# Patient Record
Sex: Male | Born: 1980 | Race: Black or African American | Hispanic: No | Marital: Single | Smoking: Former smoker
Health system: Southern US, Community
[De-identification: ages and names within clinical notes are randomized; demographics above are authoritative.]

## PROBLEM LIST (undated history)

## (undated) DIAGNOSIS — B20 Human immunodeficiency virus [HIV] disease: Secondary | ICD-10-CM

## (undated) DIAGNOSIS — R0602 Shortness of breath: Secondary | ICD-10-CM

## (undated) DIAGNOSIS — Z21 Asymptomatic human immunodeficiency virus [HIV] infection status: Secondary | ICD-10-CM

## (undated) HISTORY — DX: Shortness of breath: R06.02

## (undated) HISTORY — DX: Human immunodeficiency virus (HIV) disease: B20

## (undated) HISTORY — DX: Asymptomatic human immunodeficiency virus (hiv) infection status: Z21

---

## 2001-03-07 ENCOUNTER — Emergency Department (HOSPITAL_COMMUNITY): Admission: EM | Admit: 2001-03-07 | Discharge: 2001-03-07 | Payer: Self-pay | Admitting: Emergency Medicine

## 2006-06-20 ENCOUNTER — Emergency Department (HOSPITAL_COMMUNITY): Admission: EM | Admit: 2006-06-20 | Discharge: 2006-06-20 | Payer: Self-pay | Admitting: Emergency Medicine

## 2006-09-21 ENCOUNTER — Ambulatory Visit: Payer: Self-pay | Admitting: Internal Medicine

## 2006-10-09 ENCOUNTER — Ambulatory Visit: Payer: Self-pay | Admitting: Internal Medicine

## 2007-01-01 ENCOUNTER — Emergency Department (HOSPITAL_COMMUNITY): Admission: EM | Admit: 2007-01-01 | Discharge: 2007-01-01 | Payer: Self-pay | Admitting: Emergency Medicine

## 2007-04-24 ENCOUNTER — Encounter: Payer: Self-pay | Admitting: Internal Medicine

## 2007-04-24 ENCOUNTER — Emergency Department (HOSPITAL_COMMUNITY): Admission: EM | Admit: 2007-04-24 | Discharge: 2007-04-24 | Payer: Self-pay | Admitting: Emergency Medicine

## 2007-04-24 DIAGNOSIS — B20 Human immunodeficiency virus [HIV] disease: Secondary | ICD-10-CM

## 2007-05-01 ENCOUNTER — Emergency Department (HOSPITAL_COMMUNITY): Admission: EM | Admit: 2007-05-01 | Discharge: 2007-05-01 | Payer: Self-pay | Admitting: Family Medicine

## 2007-05-02 ENCOUNTER — Ambulatory Visit: Payer: Self-pay | Admitting: Internal Medicine

## 2007-05-02 ENCOUNTER — Encounter: Admission: RE | Admit: 2007-05-02 | Discharge: 2007-05-02 | Payer: Self-pay | Admitting: Internal Medicine

## 2007-05-02 LAB — CONVERTED CEMR LAB
AST: 17 units/L (ref 0–37)
Alkaline Phosphatase: 74 units/L (ref 39–117)
BUN: 7 mg/dL (ref 6–23)
Basophils Relative: 1 % (ref 0–1)
Bilirubin Urine: NEGATIVE
Chlamydia, Swab/Urine, PCR: NEGATIVE
Creatinine, Ser: 0.94 mg/dL (ref 0.40–1.50)
Eosinophils Absolute: 0.3 10*3/uL (ref 0.0–0.7)
Eosinophils Relative: 6 % — ABNORMAL HIGH (ref 0–5)
GC Probe Amp, Urine: NEGATIVE
Glucose, Bld: 85 mg/dL (ref 70–99)
HCT: 45.2 % (ref 39.0–52.0)
HIV-1 antibody: POSITIVE — AB
HIV-2 Ab: UNDETERMINED — AB
HIV: REACTIVE
Hemoglobin, Urine: NEGATIVE
Hep B Core Total Ab: NEGATIVE
Hep B S Ab: NEGATIVE
Ketones, ur: NEGATIVE mg/dL
Lymphs Abs: 1.6 10*3/uL (ref 0.7–3.3)
MCHC: 31.4 g/dL (ref 30.0–36.0)
MCV: 85.6 fL (ref 78.0–100.0)
Monocytes Relative: 6 % (ref 3–11)
Neutrophils Relative %: 58 % (ref 43–77)
Potassium: 4.3 meq/L (ref 3.5–5.3)
Protein, ur: NEGATIVE mg/dL
RBC: 5.28 M/uL (ref 4.22–5.81)
RPR Ser Ql: REACTIVE — AB
Total Bilirubin: 0.5 mg/dL (ref 0.3–1.2)
Urine Glucose: NEGATIVE mg/dL
Urobilinogen, UA: 1 (ref 0.0–1.0)
WBC: 5.5 10*3/uL (ref 4.0–10.5)

## 2007-05-08 ENCOUNTER — Telehealth: Payer: Self-pay | Admitting: Internal Medicine

## 2007-05-08 ENCOUNTER — Emergency Department (HOSPITAL_COMMUNITY): Admission: EM | Admit: 2007-05-08 | Discharge: 2007-05-08 | Payer: Self-pay | Admitting: Family Medicine

## 2007-05-15 ENCOUNTER — Telehealth: Payer: Self-pay | Admitting: Internal Medicine

## 2007-05-23 ENCOUNTER — Encounter: Payer: Self-pay | Admitting: Internal Medicine

## 2007-07-11 ENCOUNTER — Encounter: Admission: RE | Admit: 2007-07-11 | Discharge: 2007-07-11 | Payer: Self-pay | Admitting: Internal Medicine

## 2007-07-11 ENCOUNTER — Ambulatory Visit: Payer: Self-pay | Admitting: Internal Medicine

## 2007-07-11 LAB — CONVERTED CEMR LAB
AST: 17 units/L (ref 0–37)
Alkaline Phosphatase: 79 units/L (ref 39–117)
BUN: 7 mg/dL (ref 6–23)
Basophils Relative: 1 % (ref 0–1)
Eosinophils Absolute: 0.7 10*3/uL (ref 0.0–0.7)
Eosinophils Relative: 12 % — ABNORMAL HIGH (ref 0–5)
Glucose, Bld: 96 mg/dL (ref 70–99)
HCT: 44 % (ref 39.0–52.0)
HIV 1 RNA Quant: 44000 copies/mL — ABNORMAL HIGH (ref <50)
HIV-1 RNA Quant, Log: 4.64 — ABNORMAL HIGH (ref <1.70)
Lymphs Abs: 1.7 10*3/uL (ref 0.7–3.3)
MCHC: 32.3 g/dL (ref 30.0–36.0)
MCV: 83.7 fL (ref 78.0–100.0)
Monocytes Relative: 6 % (ref 3–11)
Platelets: 349 10*3/uL (ref 150–400)
Sodium: 142 meq/L (ref 135–145)
Total Bilirubin: 0.5 mg/dL (ref 0.3–1.2)
WBC: 5.6 10*3/uL (ref 4.0–10.5)

## 2007-07-25 ENCOUNTER — Encounter (INDEPENDENT_AMBULATORY_CARE_PROVIDER_SITE_OTHER): Payer: Self-pay | Admitting: *Deleted

## 2007-07-31 ENCOUNTER — Ambulatory Visit: Payer: Self-pay | Admitting: Internal Medicine

## 2007-08-31 ENCOUNTER — Encounter (INDEPENDENT_AMBULATORY_CARE_PROVIDER_SITE_OTHER): Payer: Self-pay | Admitting: *Deleted

## 2007-08-31 ENCOUNTER — Encounter: Payer: Self-pay | Admitting: Internal Medicine

## 2007-09-03 ENCOUNTER — Ambulatory Visit: Payer: Self-pay | Admitting: Internal Medicine

## 2007-09-03 ENCOUNTER — Encounter: Admission: RE | Admit: 2007-09-03 | Discharge: 2007-09-03 | Payer: Self-pay | Admitting: Internal Medicine

## 2007-09-03 LAB — CONVERTED CEMR LAB
ALT: 9 units/L (ref 0–53)
BUN: 9 mg/dL (ref 6–23)
CO2: 28 meq/L (ref 19–32)
Calcium: 9.7 mg/dL (ref 8.4–10.5)
Chloride: 105 meq/L (ref 96–112)
Creatinine, Ser: 0.93 mg/dL (ref 0.40–1.50)
Glucose, Bld: 100 mg/dL — ABNORMAL HIGH (ref 70–99)
HIV 1 RNA Quant: 113000 copies/mL — ABNORMAL HIGH (ref <50)
Hemoglobin: 14.5 g/dL (ref 13.0–17.0)
Lymphocytes Relative: 35 % (ref 12–46)
Lymphs Abs: 1.4 10*3/uL (ref 0.7–3.3)
Monocytes Absolute: 0.3 10*3/uL (ref 0.2–0.7)
Monocytes Relative: 8 % (ref 3–11)
Neutro Abs: 1.9 10*3/uL (ref 1.7–7.7)
RBC: 5.36 M/uL (ref 4.22–5.81)

## 2007-09-11 ENCOUNTER — Telehealth: Payer: Self-pay | Admitting: Internal Medicine

## 2007-11-12 ENCOUNTER — Encounter: Payer: Self-pay | Admitting: Internal Medicine

## 2007-12-11 ENCOUNTER — Encounter: Admission: RE | Admit: 2007-12-11 | Discharge: 2007-12-11 | Payer: Self-pay | Admitting: Internal Medicine

## 2007-12-11 ENCOUNTER — Ambulatory Visit: Payer: Self-pay | Admitting: Internal Medicine

## 2007-12-11 LAB — CONVERTED CEMR LAB
ALT: 12 units/L (ref 0–53)
AST: 20 units/L (ref 0–37)
Albumin: 4.2 g/dL (ref 3.5–5.2)
BUN: 12 mg/dL (ref 6–23)
Basophils Relative: 1 % (ref 0–1)
Calcium: 9.5 mg/dL (ref 8.4–10.5)
Chloride: 100 meq/L (ref 96–112)
MCHC: 31.3 g/dL (ref 30.0–36.0)
Monocytes Relative: 6 % (ref 3–12)
Neutro Abs: 2 10*3/uL (ref 1.7–7.7)
Neutrophils Relative %: 45 % (ref 43–77)
Potassium: 4.4 meq/L (ref 3.5–5.3)
RBC: 5.19 M/uL (ref 4.22–5.81)
Total Protein: 8.3 g/dL (ref 6.0–8.3)
WBC: 4.5 10*3/uL (ref 4.0–10.5)

## 2007-12-12 ENCOUNTER — Encounter: Payer: Self-pay | Admitting: Internal Medicine

## 2007-12-18 ENCOUNTER — Telehealth: Payer: Self-pay | Admitting: Internal Medicine

## 2007-12-25 ENCOUNTER — Ambulatory Visit: Payer: Self-pay | Admitting: Internal Medicine

## 2007-12-25 ENCOUNTER — Encounter (INDEPENDENT_AMBULATORY_CARE_PROVIDER_SITE_OTHER): Payer: Self-pay | Admitting: *Deleted

## 2007-12-25 DIAGNOSIS — A539 Syphilis, unspecified: Secondary | ICD-10-CM

## 2008-01-17 ENCOUNTER — Telehealth: Payer: Self-pay | Admitting: Internal Medicine

## 2008-01-22 ENCOUNTER — Emergency Department (HOSPITAL_COMMUNITY): Admission: EM | Admit: 2008-01-22 | Discharge: 2008-01-22 | Payer: Self-pay | Admitting: Family Medicine

## 2008-02-12 ENCOUNTER — Telehealth (INDEPENDENT_AMBULATORY_CARE_PROVIDER_SITE_OTHER): Payer: Self-pay | Admitting: *Deleted

## 2008-02-25 ENCOUNTER — Encounter (INDEPENDENT_AMBULATORY_CARE_PROVIDER_SITE_OTHER): Payer: Self-pay | Admitting: *Deleted

## 2008-03-13 ENCOUNTER — Telehealth (INDEPENDENT_AMBULATORY_CARE_PROVIDER_SITE_OTHER): Payer: Self-pay | Admitting: *Deleted

## 2008-03-17 ENCOUNTER — Encounter: Admission: RE | Admit: 2008-03-17 | Discharge: 2008-03-17 | Payer: Self-pay | Admitting: Internal Medicine

## 2008-03-17 ENCOUNTER — Ambulatory Visit: Payer: Self-pay | Admitting: Internal Medicine

## 2008-03-19 LAB — CONVERTED CEMR LAB
Albumin: 4.8 g/dL (ref 3.5–5.2)
Alkaline Phosphatase: 76 units/L (ref 39–117)
BUN: 13 mg/dL (ref 6–23)
Eosinophils Absolute: 1.3 10*3/uL — ABNORMAL HIGH (ref 0.0–0.7)
Eosinophils Relative: 18 % — ABNORMAL HIGH (ref 0–5)
Glucose, Bld: 86 mg/dL (ref 70–99)
HCT: 44.3 % (ref 39.0–52.0)
Hemoglobin: 13.9 g/dL (ref 13.0–17.0)
Lymphs Abs: 2.2 10*3/uL (ref 0.7–4.0)
MCV: 86.2 fL (ref 78.0–100.0)
Monocytes Relative: 6 % (ref 3–12)
RBC: 5.14 M/uL (ref 4.22–5.81)
RPR Ser Ql: REACTIVE — AB
RPR Titer: 1:2 {titer}
Total Bilirubin: 1.4 mg/dL — ABNORMAL HIGH (ref 0.3–1.2)
WBC: 7.5 10*3/uL (ref 4.0–10.5)

## 2008-04-08 ENCOUNTER — Ambulatory Visit: Payer: Self-pay | Admitting: Internal Medicine

## 2008-04-14 ENCOUNTER — Telehealth (INDEPENDENT_AMBULATORY_CARE_PROVIDER_SITE_OTHER): Payer: Self-pay | Admitting: *Deleted

## 2008-05-08 ENCOUNTER — Encounter: Admission: RE | Admit: 2008-05-08 | Discharge: 2008-05-08 | Payer: Self-pay | Admitting: Internal Medicine

## 2008-05-13 ENCOUNTER — Telehealth (INDEPENDENT_AMBULATORY_CARE_PROVIDER_SITE_OTHER): Payer: Self-pay | Admitting: *Deleted

## 2008-07-08 ENCOUNTER — Telehealth (INDEPENDENT_AMBULATORY_CARE_PROVIDER_SITE_OTHER): Payer: Self-pay | Admitting: *Deleted

## 2008-07-10 ENCOUNTER — Ambulatory Visit: Payer: Self-pay | Admitting: Internal Medicine

## 2008-07-10 LAB — CONVERTED CEMR LAB
ALT: 10 units/L (ref 0–53)
Albumin: 4.1 g/dL (ref 3.5–5.2)
CO2: 27 meq/L (ref 19–32)
Calcium: 9.7 mg/dL (ref 8.4–10.5)
Chloride: 102 meq/L (ref 96–112)
Eosinophils Relative: 8 % — ABNORMAL HIGH (ref 0–5)
Glucose, Bld: 89 mg/dL (ref 70–99)
HCT: 44.5 % (ref 39.0–52.0)
HIV 1 RNA Quant: 985 copies/mL — ABNORMAL HIGH (ref <50)
HIV-1 RNA Quant, Log: 2.99 — ABNORMAL HIGH (ref <1.70)
Hemoglobin: 14.5 g/dL (ref 13.0–17.0)
Lymphocytes Relative: 25 % (ref 12–46)
Lymphs Abs: 2.1 10*3/uL (ref 0.7–4.0)
Neutro Abs: 5.3 10*3/uL (ref 1.7–7.7)
Platelets: 327 10*3/uL (ref 150–400)
Sodium: 138 meq/L (ref 135–145)
Total Bilirubin: 0.3 mg/dL (ref 0.3–1.2)
Total Protein: 7.8 g/dL (ref 6.0–8.3)
WBC: 8.4 10*3/uL (ref 4.0–10.5)

## 2008-07-25 ENCOUNTER — Ambulatory Visit: Payer: Self-pay | Admitting: Internal Medicine

## 2008-07-25 DIAGNOSIS — L0292 Furuncle, unspecified: Secondary | ICD-10-CM

## 2008-07-25 DIAGNOSIS — L0293 Carbuncle, unspecified: Secondary | ICD-10-CM

## 2008-07-28 ENCOUNTER — Telehealth (INDEPENDENT_AMBULATORY_CARE_PROVIDER_SITE_OTHER): Payer: Self-pay | Admitting: *Deleted

## 2008-08-11 ENCOUNTER — Telehealth (INDEPENDENT_AMBULATORY_CARE_PROVIDER_SITE_OTHER): Payer: Self-pay | Admitting: *Deleted

## 2008-08-15 ENCOUNTER — Emergency Department (HOSPITAL_COMMUNITY): Admission: EM | Admit: 2008-08-15 | Discharge: 2008-08-15 | Payer: Self-pay | Admitting: Emergency Medicine

## 2008-09-05 ENCOUNTER — Telehealth (INDEPENDENT_AMBULATORY_CARE_PROVIDER_SITE_OTHER): Payer: Self-pay | Admitting: *Deleted

## 2008-10-08 ENCOUNTER — Telehealth (INDEPENDENT_AMBULATORY_CARE_PROVIDER_SITE_OTHER): Payer: Self-pay | Admitting: *Deleted

## 2008-10-27 ENCOUNTER — Ambulatory Visit: Payer: Self-pay | Admitting: Internal Medicine

## 2008-10-27 LAB — CONVERTED CEMR LAB
ALT: 9 units/L (ref 0–53)
AST: 14 units/L (ref 0–37)
Basophils Absolute: 0 10*3/uL (ref 0.0–0.1)
Basophils Relative: 0 % (ref 0–1)
CO2: 27 meq/L (ref 19–32)
Calcium: 10 mg/dL (ref 8.4–10.5)
Chloride: 105 meq/L (ref 96–112)
Creatinine, Ser: 0.97 mg/dL (ref 0.40–1.50)
Hemoglobin: 14.7 g/dL (ref 13.0–17.0)
Lymphocytes Relative: 18 % (ref 12–46)
MCHC: 31.4 g/dL (ref 30.0–36.0)
Neutro Abs: 4.6 10*3/uL (ref 1.7–7.7)
Neutrophils Relative %: 56 % (ref 43–77)
Platelets: 363 10*3/uL (ref 150–400)
RDW: 13.4 % (ref 11.5–15.5)
Sodium: 144 meq/L (ref 135–145)
Total Protein: 8.1 g/dL (ref 6.0–8.3)

## 2008-11-05 ENCOUNTER — Telehealth (INDEPENDENT_AMBULATORY_CARE_PROVIDER_SITE_OTHER): Payer: Self-pay | Admitting: *Deleted

## 2008-11-14 ENCOUNTER — Ambulatory Visit: Payer: Self-pay | Admitting: Internal Medicine

## 2008-12-05 ENCOUNTER — Telehealth (INDEPENDENT_AMBULATORY_CARE_PROVIDER_SITE_OTHER): Payer: Self-pay | Admitting: *Deleted

## 2009-01-01 ENCOUNTER — Telehealth (INDEPENDENT_AMBULATORY_CARE_PROVIDER_SITE_OTHER): Payer: Self-pay | Admitting: *Deleted

## 2009-01-29 ENCOUNTER — Encounter (INDEPENDENT_AMBULATORY_CARE_PROVIDER_SITE_OTHER): Payer: Self-pay | Admitting: *Deleted

## 2009-02-12 ENCOUNTER — Ambulatory Visit: Payer: Self-pay | Admitting: Internal Medicine

## 2009-02-12 LAB — CONVERTED CEMR LAB
Albumin: 4.6 g/dL (ref 3.5–5.2)
Alkaline Phosphatase: 65 units/L (ref 39–117)
BUN: 9 mg/dL (ref 6–23)
Basophils Absolute: 0.1 10*3/uL (ref 0.0–0.1)
CO2: 26 meq/L (ref 19–32)
GFR calc Af Amer: >60 mL/min (ref >60)
GFR calc non Af Amer: >60 mL/min (ref >60)
Glucose, Bld: 57 mg/dL — ABNORMAL LOW (ref 70–99)
HIV 1 RNA Quant: 79 copies/mL — ABNORMAL HIGH (ref <48)
HIV-1 RNA Quant, Log: 1.9 — ABNORMAL HIGH (ref <1.68)
Lymphocytes Relative: 27 % (ref 12–46)
Lymphs Abs: 1.5 10*3/uL (ref 0.7–4.0)
Neutro Abs: 3 10*3/uL (ref 1.7–7.7)
Platelets: 351 10*3/uL (ref 150–400)
Potassium: 4.2 meq/L (ref 3.5–5.3)
RDW: 13.2 % (ref 11.5–15.5)
Sodium: 139 meq/L (ref 135–145)
Total Bilirubin: 1 mg/dL (ref 0.3–1.2)
Total Protein: 7.7 g/dL (ref 6.0–8.3)
WBC: 5.4 10*3/uL (ref 4.0–10.5)

## 2009-02-27 ENCOUNTER — Ambulatory Visit: Payer: Self-pay | Admitting: Internal Medicine

## 2009-02-27 DIAGNOSIS — B356 Tinea cruris: Secondary | ICD-10-CM

## 2009-03-02 ENCOUNTER — Telehealth (INDEPENDENT_AMBULATORY_CARE_PROVIDER_SITE_OTHER): Payer: Self-pay | Admitting: *Deleted

## 2009-04-01 ENCOUNTER — Telehealth (INDEPENDENT_AMBULATORY_CARE_PROVIDER_SITE_OTHER): Payer: Self-pay | Admitting: *Deleted

## 2009-04-30 ENCOUNTER — Telehealth (INDEPENDENT_AMBULATORY_CARE_PROVIDER_SITE_OTHER): Payer: Self-pay | Admitting: *Deleted

## 2009-05-26 ENCOUNTER — Telehealth (INDEPENDENT_AMBULATORY_CARE_PROVIDER_SITE_OTHER): Payer: Self-pay | Admitting: *Deleted

## 2009-05-28 ENCOUNTER — Ambulatory Visit: Payer: Self-pay | Admitting: Internal Medicine

## 2009-05-28 LAB — CONVERTED CEMR LAB
ALT: 8 units/L (ref 0–53)
AST: 14 units/L (ref 0–37)
Alkaline Phosphatase: 63 units/L (ref 39–117)
Basophils Relative: 1 % (ref 0–1)
Creatinine, Ser: 1.02 mg/dL (ref 0.40–1.50)
Eosinophils Absolute: 0.4 10*3/uL (ref 0.0–0.7)
Eosinophils Relative: 6 % — ABNORMAL HIGH (ref 0–5)
HCT: 44.1 % (ref 39.0–52.0)
Lymphs Abs: 2.1 10*3/uL (ref 0.7–4.0)
MCHC: 32.9 g/dL (ref 30.0–36.0)
MCV: 85.5 fL (ref 78.0–100.0)
Platelets: 332 10*3/uL (ref 150–400)
Sodium: 141 meq/L (ref 135–145)
Total Bilirubin: 0.5 mg/dL (ref 0.3–1.2)
WBC: 6.1 10*3/uL (ref 4.0–10.5)

## 2009-06-25 ENCOUNTER — Telehealth (INDEPENDENT_AMBULATORY_CARE_PROVIDER_SITE_OTHER): Payer: Self-pay | Admitting: *Deleted

## 2009-07-08 ENCOUNTER — Ambulatory Visit: Payer: Self-pay | Admitting: Internal Medicine

## 2009-07-24 ENCOUNTER — Telehealth (INDEPENDENT_AMBULATORY_CARE_PROVIDER_SITE_OTHER): Payer: Self-pay | Admitting: *Deleted

## 2009-08-21 ENCOUNTER — Telehealth (INDEPENDENT_AMBULATORY_CARE_PROVIDER_SITE_OTHER): Payer: Self-pay | Admitting: *Deleted

## 2009-08-25 ENCOUNTER — Ambulatory Visit: Payer: Self-pay | Admitting: Internal Medicine

## 2009-08-27 ENCOUNTER — Ambulatory Visit: Payer: Self-pay | Admitting: Internal Medicine

## 2009-09-03 ENCOUNTER — Ambulatory Visit: Payer: Self-pay | Admitting: Internal Medicine

## 2009-09-10 ENCOUNTER — Ambulatory Visit: Payer: Self-pay | Admitting: Internal Medicine

## 2009-09-28 ENCOUNTER — Telehealth (INDEPENDENT_AMBULATORY_CARE_PROVIDER_SITE_OTHER): Payer: Self-pay | Admitting: *Deleted

## 2009-10-21 ENCOUNTER — Ambulatory Visit: Payer: Self-pay | Admitting: Internal Medicine

## 2009-10-21 LAB — CONVERTED CEMR LAB
ALT: 11 units/L (ref 0–53)
AST: 15 units/L (ref 0–37)
Albumin: 4.2 g/dL (ref 3.5–5.2)
Alkaline Phosphatase: 54 units/L (ref 39–117)
Basophils Absolute: 0.1 10*3/uL (ref 0.0–0.1)
Hemoglobin: 14.2 g/dL (ref 13.0–17.0)
Lymphocytes Relative: 35 % (ref 12–46)
Neutro Abs: 2.1 10*3/uL (ref 1.7–7.7)
Platelets: 317 10*3/uL (ref 150–400)
Potassium: 4.1 meq/L (ref 3.5–5.3)
RDW: 12.8 % (ref 11.5–15.5)
Sodium: 142 meq/L (ref 135–145)
Total Protein: 7.8 g/dL (ref 6.0–8.3)

## 2009-10-23 ENCOUNTER — Telehealth (INDEPENDENT_AMBULATORY_CARE_PROVIDER_SITE_OTHER): Payer: Self-pay | Admitting: *Deleted

## 2009-11-11 ENCOUNTER — Ambulatory Visit: Payer: Self-pay | Admitting: Internal Medicine

## 2009-11-18 ENCOUNTER — Telehealth (INDEPENDENT_AMBULATORY_CARE_PROVIDER_SITE_OTHER): Payer: Self-pay | Admitting: *Deleted

## 2009-12-09 ENCOUNTER — Ambulatory Visit: Payer: Self-pay | Admitting: Internal Medicine

## 2009-12-09 DIAGNOSIS — R21 Rash and other nonspecific skin eruption: Secondary | ICD-10-CM

## 2009-12-09 LAB — CONVERTED CEMR LAB
AST: 15 units/L (ref 0–37)
BUN: 9 mg/dL (ref 6–23)
Basophils Absolute: 0 10*3/uL (ref 0.0–0.1)
Calcium: 9.3 mg/dL (ref 8.4–10.5)
Chloride: 105 meq/L (ref 96–112)
Creatinine, Ser: 1 mg/dL (ref 0.40–1.50)
Eosinophils Absolute: 0.3 10*3/uL (ref 0.0–0.7)
Eosinophils Relative: 5 % (ref 0–5)
HCT: 41.7 % (ref 39.0–52.0)
Lymphs Abs: 1.2 10*3/uL (ref 0.7–4.0)
MCV: 86.3 fL (ref >=78.0)
Platelets: 326 10*3/uL (ref 150–400)
RDW: 12.7 % (ref 11.5–15.5)
RPR Ser Ql: REACTIVE — AB
RPR Titer: 1:2 {titer}
T pallidum Antibodies (TP-PA): 65.6 — ABNORMAL HIGH (ref <1.0)

## 2009-12-11 ENCOUNTER — Encounter: Payer: Self-pay | Admitting: Internal Medicine

## 2009-12-11 LAB — CONVERTED CEMR LAB: HIV 1 RNA Quant: 59100 copies/mL — ABNORMAL HIGH (ref <48)

## 2009-12-15 ENCOUNTER — Telehealth (INDEPENDENT_AMBULATORY_CARE_PROVIDER_SITE_OTHER): Payer: Self-pay | Admitting: *Deleted

## 2009-12-19 ENCOUNTER — Telehealth (INDEPENDENT_AMBULATORY_CARE_PROVIDER_SITE_OTHER): Payer: Self-pay | Admitting: *Deleted

## 2009-12-30 ENCOUNTER — Ambulatory Visit: Payer: Self-pay | Admitting: Internal Medicine

## 2010-01-12 ENCOUNTER — Encounter (INDEPENDENT_AMBULATORY_CARE_PROVIDER_SITE_OTHER): Payer: Self-pay | Admitting: *Deleted

## 2010-01-18 ENCOUNTER — Telehealth (INDEPENDENT_AMBULATORY_CARE_PROVIDER_SITE_OTHER): Payer: Self-pay | Admitting: *Deleted

## 2010-02-10 ENCOUNTER — Encounter: Payer: Self-pay | Admitting: Internal Medicine

## 2010-02-12 ENCOUNTER — Telehealth (INDEPENDENT_AMBULATORY_CARE_PROVIDER_SITE_OTHER): Payer: Self-pay | Admitting: *Deleted

## 2010-03-10 ENCOUNTER — Ambulatory Visit: Payer: Self-pay | Admitting: Internal Medicine

## 2010-03-10 LAB — CONVERTED CEMR LAB
ALT: 8 units/L (ref 0–53)
AST: 16 units/L (ref 0–37)
BUN: 7 mg/dL (ref 6–23)
Basophils Absolute: 0 10*3/uL (ref 0.0–0.1)
Basophils Relative: 1 % (ref 0–1)
Creatinine, Ser: 0.88 mg/dL (ref 0.40–1.50)
Eosinophils Absolute: 0.3 10*3/uL (ref 0.0–0.7)
HDL: 54 mg/dL (ref >39)
HIV 1 RNA Quant: 1460 copies/mL — ABNORMAL HIGH (ref <48)
HIV-1 RNA Quant, Log: 3.16 — ABNORMAL HIGH (ref <1.68)
MCHC: 31.5 g/dL (ref 30.0–36.0)
MCV: 85.7 fL (ref 78.0–100.0)
Neutrophils Relative %: 48 % (ref 43–77)
Platelets: 320 10*3/uL (ref 150–400)
RDW: 12.8 % (ref 11.5–15.5)
Total Bilirubin: 0.5 mg/dL (ref 0.3–1.2)
Total CHOL/HDL Ratio: 1.9
VLDL: 23 mg/dL (ref 0–40)
WBC: 4.2 10*3/uL (ref 4.0–10.5)

## 2010-03-15 ENCOUNTER — Telehealth: Payer: Self-pay | Admitting: Internal Medicine

## 2010-03-31 ENCOUNTER — Ambulatory Visit: Payer: Self-pay | Admitting: Internal Medicine

## 2010-04-26 ENCOUNTER — Telehealth: Payer: Self-pay | Admitting: Internal Medicine

## 2010-05-19 ENCOUNTER — Telehealth: Payer: Self-pay | Admitting: Internal Medicine

## 2010-06-18 ENCOUNTER — Telehealth: Payer: Self-pay | Admitting: Internal Medicine

## 2010-07-05 ENCOUNTER — Ambulatory Visit: Payer: Self-pay | Admitting: Internal Medicine

## 2010-07-05 ENCOUNTER — Telehealth: Payer: Self-pay

## 2010-07-05 LAB — CONVERTED CEMR LAB
ALT: 8 units/L (ref 0–53)
AST: 16 units/L (ref 0–37)
Basophils Absolute: 0 10*3/uL (ref 0.0–0.1)
Basophils Relative: 0 % (ref 0–1)
CO2: 26 meq/L (ref 19–32)
Creatinine, Ser: 0.88 mg/dL (ref 0.40–1.50)
Eosinophils Relative: 7 % — ABNORMAL HIGH (ref 0–5)
HCT: 42.6 % (ref 39.0–52.0)
HIV 1 RNA Quant: 87900 copies/mL — ABNORMAL HIGH (ref <48)
Lymphocytes Relative: 30 % (ref 12–46)
MCHC: 30.3 g/dL (ref 30.0–36.0)
Monocytes Absolute: 0.5 10*3/uL (ref 0.1–1.0)
Neutro Abs: 2.9 10*3/uL (ref 1.7–7.7)
Platelets: 318 10*3/uL (ref 150–400)
RDW: 13.8 % (ref 11.5–15.5)
Sodium: 139 meq/L (ref 135–145)
Total Bilirubin: 0.4 mg/dL (ref 0.3–1.2)
Total Protein: 7 g/dL (ref 6.0–8.3)

## 2010-07-16 ENCOUNTER — Telehealth: Payer: Self-pay | Admitting: Internal Medicine

## 2010-07-19 ENCOUNTER — Telehealth (INDEPENDENT_AMBULATORY_CARE_PROVIDER_SITE_OTHER): Payer: Self-pay | Admitting: *Deleted

## 2010-07-23 ENCOUNTER — Ambulatory Visit: Payer: Self-pay | Admitting: Internal Medicine

## 2010-07-27 ENCOUNTER — Telehealth (INDEPENDENT_AMBULATORY_CARE_PROVIDER_SITE_OTHER): Payer: Self-pay | Admitting: *Deleted

## 2010-08-13 ENCOUNTER — Ambulatory Visit: Payer: Self-pay | Admitting: Internal Medicine

## 2010-08-16 ENCOUNTER — Telehealth (INDEPENDENT_AMBULATORY_CARE_PROVIDER_SITE_OTHER): Payer: Self-pay | Admitting: *Deleted

## 2010-09-10 ENCOUNTER — Telehealth (INDEPENDENT_AMBULATORY_CARE_PROVIDER_SITE_OTHER): Payer: Self-pay | Admitting: *Deleted

## 2010-09-23 ENCOUNTER — Ambulatory Visit: Payer: Self-pay | Admitting: Internal Medicine

## 2010-09-23 LAB — CONVERTED CEMR LAB
Albumin: 4 g/dL (ref 3.5–5.2)
Alkaline Phosphatase: 67 units/L (ref 39–117)
BUN: 8 mg/dL (ref 6–23)
Basophils Relative: 1 % (ref 0–1)
CO2: 29 meq/L (ref 19–32)
Calcium: 9.1 mg/dL (ref 8.4–10.5)
Chloride: 105 meq/L (ref 96–112)
Glucose, Bld: 95 mg/dL (ref 70–99)
HIV-1 RNA Quant, Log: 3.4 — ABNORMAL HIGH (ref <1.30)
Hemoglobin: 13.4 g/dL (ref 13.0–17.0)
Lymphocytes Relative: 41 % (ref 12–46)
MCHC: 31.4 g/dL (ref 30.0–36.0)
Monocytes Absolute: 0.3 10*3/uL (ref 0.1–1.0)
Monocytes Relative: 7 % (ref 3–12)
Neutro Abs: 1.6 10*3/uL — ABNORMAL LOW (ref 1.7–7.7)
Neutrophils Relative %: 44 % (ref 43–77)
Potassium: 3.9 meq/L (ref 3.5–5.3)
RBC: 4.92 M/uL (ref 4.22–5.81)
Sodium: 141 meq/L (ref 135–145)
Total Protein: 7.1 g/dL (ref 6.0–8.3)
WBC: 3.6 10*3/uL — ABNORMAL LOW (ref 4.0–10.5)

## 2010-10-11 ENCOUNTER — Ambulatory Visit: Payer: Self-pay | Admitting: Internal Medicine

## 2010-10-11 DIAGNOSIS — M26609 Unspecified temporomandibular joint disorder, unspecified side: Secondary | ICD-10-CM | POA: Insufficient documentation

## 2010-12-05 LAB — CONVERTED CEMR LAB
GC Probe Amp, Urine: NEGATIVE
RPR Ser Ql: REACTIVE — AB
RPR Titer: 1:16 {titer} — AB
T pallidum Antibodies (TP-PA): 38.3 — ABNORMAL HIGH (ref <1.0)

## 2010-12-07 NOTE — Assessment & Plan Note (Signed)
Summary: rash returned/ uppper and lower body/tkk   CC:  rash returned on upper and lower body.  History of Present Illness: Pt states that the last 2 weeks he has had diarrhea after taking his HIV meds.  No abdominal pain, nausea or vomiting.  He also has had outbreaks of a pruritic papular rash.  Preventive Screening-Counseling & Management  Alcohol-Tobacco     Alcohol drinks/day: 3 every other weekend     Alcohol type: mixed drinks     Smoking Status: quit     Smoking Cessation Counseling: yes     Year Started: 2006     Year Quit: 2009     Cigars/week: 5     Passive Smoke Exposure: yes  Caffeine-Diet-Exercise     Caffeine use/day: 0     Does Patient Exercise: yes     Type of exercise: cardio     Exercise (avg: min/session): 30-60     Times/week: <3  Safety-Violence-Falls     Seat Belt Use: yes   Updated Prior Medication List: REYATAZ 300 MG  CAPS (ATAZANAVIR SULFATE) Take 1 tablet by mouth once a day TRUVADA 200-300 MG  TABS (EMTRICITABINE-TENOFOVIR) Take 1 tablet by mouth once a day * NIZORAL A-D 1 % CREAM (KETOCONAZOLE) apply two times a day NORVIR 100 MG TABS (RITONAVIR) Take 1 tablet by mouth once a day LOMOTIL 2.5-0.025 MG TABS (DIPHENOXYLATE-ATROPINE) take one tablet once or twice a day TRIAMCINOLONE ACETONIDE 0.1 % CREA (TRIAMCINOLONE ACETONIDE) apply two times a day as needed  Current Allergies (reviewed today): No known allergies  Review of Systems  The patient denies anorexia, fever, weight loss, abdominal pain, and severe indigestion/heartburn.    Vital Signs:  Patient profile:   30 year old male Height:      69 inches (175.26 cm) Weight:      159.3 pounds (72.41 kg) BMI:     23.61 Temp:     98.6 degrees F (37.00 degrees C) oral Pulse rate:   85 / minute BP sitting:   126 / 70  (left arm)  Vitals Entered By: Baxter Hire) (December 09, 2009 2:51 PM) CC: rash returned on upper and lower body Is Patient Diabetic? No Pain Assessment Patient  in pain? no      Nutritional Status BMI of 19 -24 = normal Nutritional Status Detail appetite is normal per patient  Have you ever been in a relationship where you felt threatened, hurt or afraid?No   Does patient need assistance? Functional Status Self care Ambulation Normal   Physical Exam  General:  alert, well-developed, well-nourished, and well-hydrated.   Head:  normocephalic and atraumatic.   Mouth:  pharynx pink and moist.   Skin:  few papular lesions   Impression & Recommendations:  Problem # 1:  HIV INFECTION (ICD-042) I will give him some lomotil for his dairrhea.  After reviewing his Rx pick-up history I wonder if he has been taking his meds. It appears he did not pick up meds for several months. I will obtain labs today.  I encourage him to take his meds every daym Orders: Est. Patient Level III ([address]109)  Diagnostics Reviewed:  HIV: HIV positive - not AIDS (11/11/2009)   HIV-Western blot: Positive (05/02/2007)   CD4: 460 (10/22/2009)   WBC: 5.1 (10/21/2009)   Hgb: 14.2 (10/21/2009)   HCT: 43.8 (10/21/2009)   Platelets: 317 (10/21/2009) HIV-1 RNA: 641 (10/21/2009)   HBSAg: NEG (05/02/2007)  Problem # 2:  SKIN RASH (ICD-782.1) treat with  tac cream check RPR His updated medication list for this problem includes:    Triamcinolone Acetonide 0.1 % Crea (Triamcinolone acetonide) .Marland Kitchen... Apply two times a day as needed  Medications Added to Medication List This Visit: 1)  Lomotil 2.5-0.025 Mg Tabs (Diphenoxylate-atropine) .... Take one tablet once or twice a day 2)  Triamcinolone Acetonide 0.1 % Crea (Triamcinolone acetonide) .... Apply two times a day as needed  Patient Instructions: 1)  Please schedule a follow-up appointment in 2 weeks. Prescriptions: TRIAMCINOLONE ACETONIDE 0.1 % CREA (TRIAMCINOLONE ACETONIDE) apply two times a day as needed  #60gm x 1   Entered and Authorized by:   Yisroel Ramming MD   Signed by:   Yisroel Ramming MD on 12/09/2009   Method used:    Print then Give to Patient   RxID:   [id number] LOMOTIL 2.5-0.025 MG TABS (DIPHENOXYLATE-ATROPINE) take one tablet once or twice a day  #60 x 1   Entered and Authorized by:   Yisroel Ramming MD   Signed by:   Yisroel Ramming MD on 12/09/2009   Method used:   Print then Give to Patient   RxID:   [id number]

## 2010-12-07 NOTE — Progress Notes (Signed)
Summary: [facility]/pt assist meds arrived forFeb  Phone Note Refill Request      Prescriptions: NORVIR 100 MG TABS (RITONAVIR) Take 1 tablet by mouth once a day  #30 x 0   Entered by:   Paulo Fruit  BS,CPht II,MPH   Authorized by:   Yisroel Ramming MD   Signed by:   Paulo Fruit  BS,CPht II,MPH on 12/19/2009   Method used:   Samples Given   RxID:   [id number] TRUVADA 200-300 MG  TABS (EMTRICITABINE-TENOFOVIR) Take 1 tablet by mouth once a day  #30 x 0   Entered by:   Paulo Fruit  BS,CPht II,MPH   Authorized by:   Yisroel Ramming MD   Signed by:   Paulo Fruit  BS,CPht II,MPH on 12/19/2009   Method used:   Samples Given   RxID:   [id number] REYATAZ 300 MG  CAPS (ATAZANAVIR SULFATE) Take 1 tablet by mouth once a day  #30 x 0   Entered by:   Paulo Fruit  BS,CPht II,MPH   Authorized by:   Yisroel Ramming MD   Signed by:   Paulo Fruit  BS,CPht II,MPH on 12/19/2009   Method used:   Samples Given   RxID:   [id number]   Patient Assist Medication Verification: Medication: NOrvir 100mg  WNU#[id number] Exp Date:13 Feb 2011 Tech approval:MLD                Patient Assist Medication Verification: Medication:Reyataz 300mg  Lot#[id number] Exp Date:Oct 2012 Tech approval:MLD                Patient Assist Medication Verification: Medication:Truvada Lot# [id number] Exp Date:08 2014 Tech approval:MLD Will call pt on Monday, 12/21/09 Paulo Fruit  BS,CPht II,MPH  December 19, 2009 10:23 AM

## 2010-12-07 NOTE — Assessment & Plan Note (Signed)
Summary: f/u [mkj]   CC:  Follow-up visit, lab results, and pt. has some issues and would like to see THP.  History of Present Illness: Pt is having housing issues and has missed about a weeks worth of meds as a result.  He is currently back on them.  He would like to talk to someone from [facility] about his issues. He has been feeling well.  Preventive Screening-Counseling & Management  Alcohol-Tobacco     Alcohol drinks/day: 3 every other weekend     Alcohol type: mixed drinks     Smoking Status: quit     Smoking Cessation Counseling: yes     Packs/Day: 3 CIGGS     Year Started: 2006     Year Quit: 2009     Cigars/week: 5     Passive Smoke Exposure: yes  Caffeine-Diet-Exercise     Caffeine use/day: 0     Does Patient Exercise: yes     Type of exercise: cardio     Exercise (avg: min/session): 30-60     Times/week: <3  Hep-HIV-STD-Contraception     HIV Risk: no  Safety-Violence-Falls     Seat Belt Use: yes      Sexual History:  no relationship.        Drug Use:  never.        Blood Transfusions:  no.        Travel History:  none.    Comments: pt. declined condoms   Updated Prior Medication List: REYATAZ 300 MG  CAPS (ATAZANAVIR SULFATE) Take 1 tablet by mouth once a day TRUVADA 200-300 MG  TABS (EMTRICITABINE-TENOFOVIR) Take 1 tablet by mouth once a day * NIZORAL A-D 1 % CREAM (KETOCONAZOLE) apply two times a day NORVIR 100 MG TABS (RITONAVIR) Take 1 tablet by mouth once a day LOMOTIL 2.5-0.025 MG TABS (DIPHENOXYLATE-ATROPINE) take one tablet once or twice a day TRIAMCINOLONE ACETONIDE 0.1 % CREA (TRIAMCINOLONE ACETONIDE) apply two times a day as needed  Current Allergies (reviewed today): No known allergies  Review of Systems  The patient denies anorexia, fever, and weight loss.    Vital Signs:  Patient profile:   30 year old male Height:      69 inches (175.26 cm) Weight:      151.8 pounds (69 kg) BMI:     22.50 Temp:     98.4 degrees F (36.89 degrees C)  oral Pulse rate:   64 / minute BP sitting:   135 / 88  (right arm)  Vitals Entered By: Wendall Mola CMA Duncan Dull) (Mar 31, 2010 3:06 PM) CC: Follow-up visit, lab results, pt. has some issues and would like to see THP Is Patient Diabetic? No Pain Assessment Patient in pain? no      Nutritional Status BMI of 19 -24 = normal Nutritional Status Detail appetite "ok"  Have you ever been in a relationship where you felt threatened, hurt or afraid?No   Does patient need assistance? Functional Status Self care Ambulation Normal Comments no missed doses of meds per patient   Physical Exam  General:  alert, well-developed, well-nourished, and well-hydrated.   Head:  normocephalic and atraumatic.   Mouth:  pharynx pink and moist.  no thrush  Lungs:  normal breath sounds.   Heart:  normal rate, regular rhythm, and no murmur.      Impression & Recommendations:  Problem # 1:  HIV INFECTION (ICD-042) Pt.s most recent CD4ct was 460 and VL 1460 .  Pt instructed to continue  the current antiretroviral regimen.  Pt encouraged to take medication regularly and not miss doses.  Pt will f/u in 3 months for repeat blood work and will see me 2 weeks later.  Diagnostics Reviewed:  HIV: HIV positive - not AIDS (11/11/2009)   HIV-Western blot: Positive (05/02/2007)   CD4: 460 (03/10/2010)   WBC: 4.2 (03/10/2010)   Hgb: 14.0 (03/10/2010)   HCT: 44.5 (03/10/2010)   Platelets: 320 (03/10/2010) HIV genotype: * (12/11/2009)   HIV-1 RNA: 1460 (03/10/2010)   HBSAg: NEG (05/02/2007)  Orders: Est. Patient Level III (99213)Future Orders: T-CD4SP ([facility]) (CD4SP) ... 05/12/2010 T-HIV Viral Load [phone]) ... 05/12/2010 T-Comprehensive Metabolic Panel [phone]) ... 05/12/2010 T-CBC w/Diff ([id number]) ... 05/12/2010     refer to THP  Patient Instructions: 1)  Please schedule a follow-up appointment in 8 weeks, 2 weeks after labs.

## 2010-12-07 NOTE — Progress Notes (Signed)
Summary: [facility]/pt assist meds arrived for June  Phone Note Refill Request      Prescriptions: LOMOTIL 2.5-0.025 MG TABS (DIPHENOXYLATE-ATROPINE) take one tablet once or twice a day  #60 x 0   Entered by:   Paulo Fruit  BS,CPht II,MPH   Authorized by:   Yisroel Ramming MD   Signed by:   Paulo Fruit  BS,CPht II,MPH on 04/26/2010   Method used:   Samples Given   RxID:   [id number] NORVIR 100 MG TABS (RITONAVIR) Take 1 tablet by mouth once a day  #30 x 0   Entered by:   Paulo Fruit  BS,CPht II,MPH   Authorized by:   Yisroel Ramming MD   Signed by:   Paulo Fruit  BS,CPht II,MPH on 04/26/2010   Method used:   Samples Given   RxID:   [id number] REYATAZ 300 MG  CAPS (ATAZANAVIR SULFATE) Take 1 tablet by mouth once a day  #30 x 0   Entered by:   Paulo Fruit  BS,CPht II,MPH   Authorized by:   Yisroel Ramming MD   Signed by:   Paulo Fruit  BS,CPht II,MPH on 04/26/2010   Method used:   Samples Given   RxID:   [id number]  Patient Assist Medication Verification: Medication name: Diphenonxylate/atropine  RX # [id number] Tech approval:MLD  Patient Assist Medication Verification: Medication name:Norvir 100mg  RX # [id number] Tech approval:MLD  Patient Assist Medication Verification: Medication name:Reyataz 300mg  RX # [id number] Tech approval:MLD  [facility] sent the Truvada to patient's home.  They are aware of the issue to send all mediations to one place at one time and are currently working on the problem. Call placed to patient with message that assistance medications are ready for pick-up. Left message on patient's voicemail Paulo Fruit  BS,CPht II,MPH  April 26, 2010 9:27 AM

## 2010-12-07 NOTE — Progress Notes (Signed)
Summary: [facility]cadap/pt assist meds arrived for Aug --unable to reach pt.  Phone Note Refill Request      Prescriptions: LOMOTIL 2.5-0.025 MG TABS (DIPHENOXYLATE-ATROPINE) take one tablet once or twice a day  #60 x 0   Entered by:   Paulo Fruit  BS,CPht II,MPH   Authorized by:   Yisroel Ramming MD   Signed by:   Paulo Fruit  BS,CPht II,MPH on 06/18/2010   Method used:   Samples Given   RxID:   [id number] NORVIR 100 MG TABS (RITONAVIR) Take 1 tablet by mouth once a day  #30 x 0   Entered by:   Paulo Fruit  BS,CPht II,MPH   Authorized by:   Yisroel Ramming MD   Signed by:   Paulo Fruit  BS,CPht II,MPH on 06/18/2010   Method used:   Samples Given   RxID:   [id number] TRUVADA 200-300 MG  TABS (EMTRICITABINE-TENOFOVIR) Take 1 tablet by mouth once a day  #30 x 0   Entered by:   Paulo Fruit  BS,CPht II,MPH   Authorized by:   Yisroel Ramming MD   Signed by:   Paulo Fruit  BS,CPht II,MPH on 06/18/2010   Method used:   Samples Given   RxID:   [id number] REYATAZ 300 MG  CAPS (ATAZANAVIR SULFATE) Take 1 tablet by mouth once a day  #30 x 0   Entered by:   Paulo Fruit  BS,CPht II,MPH   Authorized by:   Yisroel Ramming MD   Signed by:   Paulo Fruit  BS,CPht II,MPH on 06/18/2010   Method used:   Samples Given   RxID:   [id number]  Patient Assist Medication Verification: Medication name:Norvir 100mg  RX # [id number] Tech approval:MLD  Patient Assist Medication Verification: Medication name:Truvada RX # [id number] Tech approval:MLD  Patient Assist Medication Verification: Medication name:diphenoxylate/Atropine RX # [id number] Tech approval:MLD  Patient Assist Medication Verification: Medication name:reyataz 300mg  RX # [id number] Tech approval:MLD Tried to contact patient.  Was unable to get through.  Need to verify updated phone number when patient comes to pick up Paulo Fruit  BS,CPht II,MPH  June 18, 2010 4:06 PM

## 2010-12-07 NOTE — Progress Notes (Signed)
Summary: proof of HIV  Phone Note Call from Patient   Caller: Patient Summary of Call: Pt. called requesting something showing HIV status.  Western blot and labs were printed and pt. to come to office to pick up.  Initial call taken by: Wendall Mola CMA Duncan Dull),  July 19, 2010 2:38 PM

## 2010-12-07 NOTE — Assessment & Plan Note (Signed)
Summary: 2WK F/U/VS   CC:  2 week f/u.  History of Present Illness: Pt states he feels better. He had a cold 2 weeks ago but it has resolved.  He had not been taking his meds but has re-started and has been taking them every day.  Preventive Screening-Counseling & Management  Alcohol-Tobacco     Alcohol drinks/day: 3 every other weekend     Alcohol type: mixed drinks     Smoking Status: quit     Smoking Cessation Counseling: yes     Packs/Day: 3 CIGGS     Year Started: 2006     Year Quit: 2009     Cigars/week: 5     Passive Smoke Exposure: yes  Caffeine-Diet-Exercise     Caffeine use/day: 0     Does Patient Exercise: yes     Type of exercise: cardio     Exercise (avg: min/session): 30-60     Times/week: <3  Hep-HIV-STD-Contraception     HIV Risk: no  Safety-Violence-Falls     Seat Belt Use: yes   Updated Prior Medication List: REYATAZ 300 MG  CAPS (ATAZANAVIR SULFATE) Take 1 tablet by mouth once a day TRUVADA 200-300 MG  TABS (EMTRICITABINE-TENOFOVIR) Take 1 tablet by mouth once a day * NIZORAL A-D 1 % CREAM (KETOCONAZOLE) apply two times a day NORVIR 100 MG TABS (RITONAVIR) Take 1 tablet by mouth once a day LOMOTIL 2.5-0.025 MG TABS (DIPHENOXYLATE-ATROPINE) take one tablet once or twice a day TRIAMCINOLONE ACETONIDE 0.1 % CREA (TRIAMCINOLONE ACETONIDE) apply two times a day as needed  Current Allergies (reviewed today): No known allergies  Review of Systems  The patient denies anorexia, fever, and weight loss.    Vital Signs:  Patient profile:   30 year old male Height:      69 inches (175.26 cm) Weight:      159.3 pounds (72.41 kg) BMI:     23.61 Temp:     97.9 degrees F (36.61 degrees C) oral Pulse rate:   70 / minute BP sitting:   134 / 71  (left arm)  Vitals Entered By: Starleen Arms CMA (December 30, 2009 3:54 PM) CC: 2 week f/u Is Patient Diabetic? No Pain Assessment Patient in pain? no      Nutritional Status BMI of 19 -24 =  normal Nutritional Status Detail nl  Does patient need assistance? Functional Status Self care Ambulation Normal   Physical Exam  General:  alert, well-developed, well-nourished, and well-hydrated.   Head:  normocephalic and atraumatic.   Mouth:  pharynx pink and moist.   Lungs:  normal breath sounds.      Impression & Recommendations:  Problem # 1:  HIV INFECTION (ICD-042) Will repeat labs in 6 weeks.  Genotype did not show any resistance. Diagnostics Reviewed:  HIV: HIV positive - not AIDS (11/11/2009)   HIV-Western blot: Positive (05/02/2007)   CD4: 380 (12/10/2009)   WBC: 5.2 (12/09/2009)   Hgb: 13.4 (12/09/2009)   HCT: 41.7 (12/09/2009)   Platelets: 326 (12/09/2009) HIV genotype: * (12/11/2009)   HIV-1 RNA: 59100 (12/09/2009)   HBSAg: NEG (05/02/2007)  Orders: Est. Patient Level III (99213)Future Orders: T-CD4SP ([facility]) (CD4SP) ... 02/10/2010 T-HIV Viral Load [phone]) ... 02/10/2010 T-Comprehensive Metabolic Panel [phone]) ... 02/10/2010 T-CBC w/Diff ([id number]) ... 02/10/2010 T-Lipid Profile [id number]) ... 02/10/2010  Patient Instructions: 1)  Please schedule a follow-up appointment in 8 weeks.  Process Orders Check Orders Results:     [facility]: ABN not required for this  insurance Tests Sent for requisitioning (December 30, 2009 4:21 PM):     02/10/2010: [facility] -- T-HIV Viral Load [phone] (signed)     02/10/2010: [facility] -- T-Comprehensive Metabolic Panel [[id number]] (signed)     02/10/2010: [facility] -- T-CBC w/Diff [[id number]] (signed)     02/10/2010: [facility] -- T-Lipid Profile [phone] (signed)

## 2010-12-07 NOTE — Assessment & Plan Note (Signed)
Summary: F/U/VS   CC:  follow-up visit and lab results.  History of Present Illness: patient here for follow-up.  He's been taking his medications every day.  He complains of some left-sided neck pain and jaw pain.  He does complain of some clicking when he opens his mouth.  Preventive Screening-Counseling & Management  Alcohol-Tobacco     Alcohol drinks/day: 3 every other weekend     Alcohol type: mixed drinks     Smoking Status: quit     Smoking Cessation Counseling: yes     Packs/Day: 3 CIGGS     Year Started: 2006     Year Quit: 2009     Cigars/week: 5     Passive Smoke Exposure: yes  Caffeine-Diet-Exercise     Caffeine use/day: 0     Does Patient Exercise: yes     Type of exercise: cardio     Exercise (avg: min/session): 30-60     Times/week: <3  Hep-HIV-STD-Contraception     HIV Risk: no risk noted  Safety-Violence-Falls     Seat Belt Use: yes      Sexual History:  dating.        Drug Use:  never.        Blood Transfusions:  no.        Travel History:  none.    Comments: pt. given condoms   Updated Prior Medication List: REYATAZ 300 MG  CAPS (ATAZANAVIR SULFATE) Take 1 tablet by mouth once a day TRUVADA 200-300 MG  TABS (EMTRICITABINE-TENOFOVIR) Take 1 tablet by mouth once a day * NIZORAL A-D 1 % CREAM (KETOCONAZOLE) apply two times a day NORVIR 100 MG TABS (RITONAVIR) Take 1 tablet by mouth once a day LOMOTIL 2.5-0.025 MG TABS (DIPHENOXYLATE-ATROPINE) take one tablet once or twice a day TRIAMCINOLONE ACETONIDE 0.1 % CREA (TRIAMCINOLONE ACETONIDE) apply two times a day as needed  Current Allergies (reviewed today): No known allergies  Social History: Sexual History:  dating  Review of Systems  The patient denies anorexia, fever, weight loss, and abdominal pain.    Vital Signs:  Patient profile:   30 year old male Height:      69 inches (175.26 cm) Weight:      159.8 pounds (72.64 kg) BMI:     23.68 Temp:     98.2 degrees F (36.78  degrees C) oral Pulse rate:   59 / minute BP sitting:   130 / 76  (right arm)  Vitals Entered By: Wendall Mola CMA ( AAMA) (October 11, 2010 10:00 AM) CC: follow-up visit, lab results Is Patient Diabetic? No Pain Assessment Patient in pain? no      Nutritional Status BMI of 19 -24 = normal Nutritional Status Detail appetite "good"  Have you ever been in a relationship where you felt threatened, hurt or afraid?No   Does patient need assistance? Functional Status Self care Ambulation Normal Comments no missed doses of meds per pt.   Physical Exam  General:  alert, well-developed, well-nourished, and well-hydrated.   Head:  normocephalic and atraumatic.   Mouth:  pharynx pink and moist.  Pt has significant clicking when he opens his mouth Lungs:  normal breath sounds.     Impression & Recommendations:  Problem # 1:  HIV INFECTION (ICD-042) VL decreasing. Genotype did not show any resistance. Pt encouraged to continue his current regimen and try not to miss any doses. he willf/u for repeat labsin 3 months. Diagnostics Reviewed:  HIV: HIV positive - not  AIDS (11/11/2009)   HIV-Western blot: Positive (05/02/2007)   CD4: 420 (09/24/2010)   WBC: 3.6 (09/23/2010)   Hgb: 13.4 (09/23/2010)   HCT: 42.7 (09/23/2010)   Platelets: 307 (09/23/2010) HIV genotype: See Comment (07/23/2010)   HIV-1 RNA: 2510 (09/23/2010)   HBSAg: NEG (05/02/2007)  Orders: Est. Patient Level III (99213)Future Orders: T-CD4SP ([facility]) (CD4SP) ... 01/09/2011 T-HIV Viral Load [phone]) ... 01/09/2011 T-Comprehensive Metabolic Panel [phone]) ... 01/09/2011 T-CBC w/Diff ([id number]) ... 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      PPD Results    Date of reading: 07/26/2010    Results: < 5mm    Interpretation: negative   Appended Document: added  genotype/TY  Process Orders Check Orders Results:     [facility]: ABN not required for this insurance Tests Sent for requisitioning (December 11, 2009 2:25 PM):     12/11/2009: [facility] -- T-HIV Genotype [phone] (signed)

## 2010-12-07 NOTE — Assessment & Plan Note (Signed)
Summary: 3wk f/u/vs   CC:  follow-up visit and lab results.  History of Present Illness: Pt here to get lab results. He is feeling well. He states that he has been taking his meds every day since his last visit. Prior to that he did miss some doses.  Preventive Screening-Counseling & Management  Alcohol-Tobacco     Alcohol drinks/day: 3 every other weekend     Alcohol type: mixed drinks     Smoking Status: quit     Smoking Cessation Counseling: yes     Packs/Day: 3 CIGGS     Year Started: 2006     Year Quit: 2009     Cigars/week: 5     Passive Smoke Exposure: yes  Caffeine-Diet-Exercise     Caffeine use/day: 0     Does Patient Exercise: yes     Type of exercise: cardio     Exercise (avg: min/session): 30-60     Times/week: <3  Hep-HIV-STD-Contraception     HIV Risk: risk noted  Safety-Violence-Falls     Seat Belt Use: yes      Sexual History:  no relationship.        Drug Use:  never.        Blood Transfusions:  no.        Travel History:  none.    Comments: pt. declined condoms   Updated Prior Medication List: REYATAZ 300 MG  CAPS (ATAZANAVIR SULFATE) Take 1 tablet by mouth once a day TRUVADA 200-300 MG  TABS (EMTRICITABINE-TENOFOVIR) Take 1 tablet by mouth once a day * NIZORAL A-D 1 % CREAM (KETOCONAZOLE) apply two times a day NORVIR 100 MG TABS (RITONAVIR) Take 1 tablet by mouth once a day LOMOTIL 2.5-0.025 MG TABS (DIPHENOXYLATE-ATROPINE) take one tablet once or twice a day TRIAMCINOLONE ACETONIDE 0.1 % CREA (TRIAMCINOLONE ACETONIDE) apply two times a day as needed  Current Allergies (reviewed today): No known allergies  Review of Systems  The patient denies anorexia, fever, and weight loss.    Vital Signs:  Patient profile:   30 year old male Height:      69 inches (175.26 cm) Weight:      153.12 pounds (69.60 kg) BMI:     22.69 Temp:     98.2 degrees F (36.78 degrees C) oral Pulse rate:   61 / minute BP sitting:   133 / 87  (right  arm)  Vitals Entered By: Wendall Mola CMA Duncan Dull) (August 13, 2010 10:46 AM) CC: follow-up visit, lab results Is Patient Diabetic? No Pain Assessment Patient in pain? no      Nutritional Status BMI of 19 -24 = normal Nutritional Status Detail appetite "good"  Have you ever been in a relationship where you felt threatened, hurt or afraid?No   Does patient need assistance? Functional Status Self care Ambulation Normal Comments no missed doses of meds per pt.   Physical Exam  General:  alert, well-developed, well-nourished, and well-hydrated.   Head:  normocephalic and atraumatic.   Lungs:  normal breath sounds.      Impression & Recommendations:  Problem # 1:  HIV INFECTION (ICD-042) Pt's genotype did not show any resistance. I encouraged him to take his meds regularl.  He will return in 6 weeks for repeat labs.  Diagnostics Reviewed:  HIV: HIV positive - not AIDS (11/11/2009)   HIV-Western blot: Positive (05/02/2007)   CD4: 460 (07/06/2010)   WBC: 5.3 (07/05/2010)   Hgb: 12.9 (07/05/2010)   HCT: 42.6 (07/05/2010)  Platelets: 318 (07/05/2010) HIV genotype: See Comment (07/23/2010)   HIV-1 RNA: 16109 (07/05/2010)   HBSAg: NEG (05/02/2007)  Orders: Est. Patient Level III (99213)Future Orders: T-CD4SP ([facility]) (CD4SP) ... 09/24/2010 T-HIV Viral Load [phone]) ... 09/24/2010 T-Comprehensive Metabolic Panel [phone]) ... 09/24/2010 T-CBC w/Diff ([id number]) ... 09/24/2010  Patient Instructions: 1)  Please schedule a follow-up appointment in 8 weeks, 2 weeks after labs.

## 2010-12-07 NOTE — Progress Notes (Signed)
Summary: [facility]/pt assist meds arrived for Jan  Phone Note Refill Request      Prescriptions: NORVIR 100 MG TABS (RITONAVIR) Take 1 tablet by mouth once a day  #30 x 0   Entered by:   Paulo Fruit  BS,CPht II,MPH   Authorized by:   Yisroel Ramming MD   Signed by:   Paulo Fruit  BS,CPht II,MPH on 11/18/2009   Method used:   Samples Given   RxID:   [id number] TRUVADA 200-300 MG  TABS (EMTRICITABINE-TENOFOVIR) Take 1 tablet by mouth once a day  #30 x 0   Entered by:   Paulo Fruit  BS,CPht II,MPH   Authorized by:   Yisroel Ramming MD   Signed by:   Paulo Fruit  BS,CPht II,MPH on 11/18/2009   Method used:   Samples Given   RxID:   [id number] REYATAZ 300 MG  CAPS (ATAZANAVIR SULFATE) Take 1 tablet by mouth once a day  #30 x 0   Entered by:   Paulo Fruit  BS,CPht II,MPH   Authorized by:   Yisroel Ramming MD   Signed by:   Paulo Fruit  BS,CPht II,MPH on 11/18/2009   Method used:   Samples Given   RxID:   [id number]   Patient Assist Medication Verification: Medication: Reyataz 300mg  WNU#[id number] Exp Date:Oct 2012 Tech approval:MLD                Patient Assist Medication Verification: Medication:Truvada YQI#[id number] Exp Date:06 2014 Tech approval:MLD                Patient Assist Medication Verification: Medication:Norvir 100mg  GLO#[id number] Exp Date: 02Mar 2012 Tech approval:MLD Call placed to patient with message that assistance medications are ready for pick-up. Left message Paulo Fruit  BS,CPht II,MPH  November 18, 2009 3:51 PM                   Appended Document: [facility]/pt assist meds arrived for Jan Dr. Philipp Deputy handed partient medication for January.

## 2010-12-07 NOTE — Progress Notes (Signed)
Summary: PPD  Phone Note Outgoing Call   Call placed by: Annice Pih Summary of Call: Pt. needs PPD at next office visit Initial call taken by: Wendall Mola CMA Duncan Dull),  July 27, 2010 4:05 PM

## 2010-12-07 NOTE — Progress Notes (Signed)
Summary: [facility]ADAP/pt assist meds arrived for Mar  Phone Note Refill Request      Prescriptions: NORVIR 100 MG TABS (RITONAVIR) Take 1 tablet by mouth once a day  #30 x 0   Entered by:   Paulo Fruit  BS,CPht II,MPH   Authorized by:   Yisroel Ramming MD   Signed by:   Paulo Fruit  BS,CPht II,MPH on 01/18/2010   Method used:   Samples Given   RxID:   [id number] TRUVADA 200-300 MG  TABS (EMTRICITABINE-TENOFOVIR) Take 1 tablet by mouth once a day  #30 x 0   Entered by:   Paulo Fruit  BS,CPht II,MPH   Authorized by:   Yisroel Ramming MD   Signed by:   Paulo Fruit  BS,CPht II,MPH on 01/18/2010   Method used:   Samples Given   RxID:   [id number] REYATAZ 300 MG  CAPS (ATAZANAVIR SULFATE) Take 1 tablet by mouth once a day  #30 x 0   Entered by:   Paulo Fruit  BS,CPht II,MPH   Authorized by:   Yisroel Ramming MD   Signed by:   Paulo Fruit  BS,CPht II,MPH on 01/18/2010   Method used:   Samples Given   RxID:   [id number]   Patient Assist Medication Verification: Medication: Norvir 100mg  HKV#[id number] E21 Exp Date:12 Mar 2011 Tech approval:MLD                Patient Assist Medication Verification: Medication:Truvada Lot# [id number] Exp Date:09 2014 Tech approval:MLD                Patient Assist Medication Verification: Medication:Reyataz 300mg  Lot#[id number] Exp Date:Nov 2012 Tech approval:MLD Call placed to patient with message that assistance medications are ready for pick-up. Left message Paulo Fruit  BS,CPht II,MPH  January 18, 2010 3:41 PM

## 2010-12-07 NOTE — Progress Notes (Signed)
Summary: ADAP- Norvir, Diphenoxylate/Atropine, Truvada, Reyataz  Phone Note Outgoing Call   Summary of Call: ADAP Norvir, Diphenoxylate/Atropine, Truvada, Reyataz Medication(s) have arrived.  Left message or spoke to patient advising them they were ready for pickup.    Initial call taken by: Altamease Oiler,  August 16, 2010 5:00 PM     Appended Document: ADAP- Norvir, Diphenoxylate/Atropine, Truvada, Reyataz Pt. picked up ADAP meds

## 2010-12-07 NOTE — Progress Notes (Signed)
Summary: ncadap meds arrived for Sept--left msg for pt to call office  Phone Note Refill Request      Prescriptions: LOMOTIL 2.5-0.025 MG TABS (DIPHENOXYLATE-ATROPINE) take one tablet once or twice a day  #60 x 0   Entered by:   Paulo Fruit  BS,CPht II,MPH   Authorized by:   Yisroel Ramming MD   Signed by:   Paulo Fruit  BS,CPht II,MPH on 07/16/2010   Method used:   Samples Given   RxID:   [id number] NORVIR 100 MG TABS (RITONAVIR) Take 1 tablet by mouth once a day  #30 x 0   Entered by:   Paulo Fruit  BS,CPht II,MPH   Authorized by:   Yisroel Ramming MD   Signed by:   Paulo Fruit  BS,CPht II,MPH on 07/16/2010   Method used:   Samples Given   RxID:   [id number] TRUVADA 200-300 MG  TABS (EMTRICITABINE-TENOFOVIR) Take 1 tablet by mouth once a day  #30 x 0   Entered by:   Paulo Fruit  BS,CPht II,MPH   Authorized by:   Yisroel Ramming MD   Signed by:   Paulo Fruit  BS,CPht II,MPH on 07/16/2010   Method used:   Samples Given   RxID:   [id number] REYATAZ 300 MG  CAPS (ATAZANAVIR SULFATE) Take 1 tablet by mouth once a day  #30 x 0   Entered by:   Paulo Fruit  BS,CPht II,MPH   Authorized by:   Yisroel Ramming MD   Signed by:   Paulo Fruit  BS,CPht II,MPH on 07/16/2010   Method used:   Samples Given   RxID:   [id number]  Patient Assist Medication Verification: Medication name: diphenoxylate/atropine RX # [id number] Tech approval:MLD  Patient Assist Medication Verification: Medication name:Reyataz 300mg  RX # [id number] Tech approval:MLD  Patient Assist Medication Verification: Medication name:truvada RX # [id number] Tech approval:MLD  Patient Assist Medication Verification: Medication name: Norvir 100mg  RX # [id number] Tech approval:MLD Call placed to patient with message that assistance medications are ready for pick-up. Left message  on pt's vm to contact our office. Paulo Fruit  BS,CPht II,MPH  July 16, 2010 2:43 PM

## 2010-12-07 NOTE — Progress Notes (Signed)
Summary: NCADAP rxes arrived  Phone Note Outgoing Call   Call placed by: Jennet Maduro RN,  September 10, 2010 2:52 PM Call placed to: Patient Action Taken: Assistance medications ready for pick up Summary of Call: NCADAP rxes arrived.  Message left for pt. to pick up rxes. Jennet Maduro RN  September 10, 2010 2:53 PM

## 2010-12-07 NOTE — Progress Notes (Signed)
Summary: refill/mld  Phone Note From Pharmacy   Caller: [facility] Reason for Call: Needs renewal Summary of Call: Need renewals on ARV's and Lomotil. Initial call taken by: Paulo Fruit  BS,CPht II,MPH,  May 19, 2010 4:44 PM    Prescriptions: LOMOTIL 2.5-0.025 MG TABS (DIPHENOXYLATE-ATROPINE) take one tablet once or twice a day  #60 x 0   Entered by:   Paulo Fruit  BS,CPht II,MPH   Authorized by:   Yisroel Ramming MD   Signed by:   Paulo Fruit  BS,CPht II,MPH on 05/19/2010   Method used:   Telephoned to ...       [facility] [phone]* (retail)       [address]       [place], [place]  [address]       Ph: [phone]       Fax:    RxID:   [id number] NORVIR 100 MG TABS (RITONAVIR) Take 1 tablet by mouth once a day  #30 x 6   Entered by:   Paulo Fruit  BS,CPht II,MPH   Authorized by:   Yisroel Ramming MD   Signed by:   Paulo Fruit  BS,CPht II,MPH on 05/19/2010   Method used:   Electronically to        [facility] [phone]* (retail)       [address]       [place], [place]  [address]       Ph: [phone]       Fax:    RxID:   [id number] TRUVADA 200-300 MG  TABS (EMTRICITABINE-TENOFOVIR) Take 1 tablet by mouth once a day  #30 x 6   Entered by:   Paulo Fruit  BS,CPht II,MPH   Authorized by:   Yisroel Ramming MD   Signed by:   Paulo Fruit  BS,CPht II,MPH on 05/19/2010   Method used:   Electronically to        [facility] [phone]* (retail)       [address]       [place], [place]  [address]       Ph: [phone]       Fax:    RxID:   [id number] REYATAZ 300 MG  CAPS (ATAZANAVIR SULFATE) Take 1 tablet by mouth once a day  #30 x 6   Entered by:   Paulo Fruit  BS,CPht II,MPH   Authorized by:   Yisroel Ramming MD   Signed by:   Paulo Fruit  BS,CPht II,MPH on 05/19/2010   Method used:   Electronically to        [facility] [phone]* (retail)       [address]       [place], [place]  [address]       Ph: [phone]       Fax:    RxID:   [id number]  Paulo Fruit  BS,CPht  II,MPH  May 19, 2010 4:47 PM

## 2010-12-07 NOTE — Progress Notes (Signed)
Summary: [facility]ADAP/pt assist meds arrived for Apr  Phone Note Refill Request      Prescriptions: NORVIR 100 MG TABS (RITONAVIR) Take 1 tablet by mouth once a day  #30 x 0   Entered by:   Paulo Fruit  BS,CPht II,MPH   Authorized by:   Yisroel Ramming MD   Signed by:   Paulo Fruit  BS,CPht II,MPH on 02/12/2010   Method used:   Samples Given   RxID:   [id number] TRUVADA 200-300 MG  TABS (EMTRICITABINE-TENOFOVIR) Take 1 tablet by mouth once a day  #30 x 0   Entered by:   Paulo Fruit  BS,CPht II,MPH   Authorized by:   Yisroel Ramming MD   Signed by:   Paulo Fruit  BS,CPht II,MPH on 02/12/2010   Method used:   Samples Given   RxID:   [id number] REYATAZ 300 MG  CAPS (ATAZANAVIR SULFATE) Take 1 tablet by mouth once a day  #30 x 0   Entered by:   Paulo Fruit  BS,CPht II,MPH   Authorized by:   Yisroel Ramming MD   Signed by:   Paulo Fruit  BS,CPht II,MPH on 02/12/2010   Method used:   Samples Given   RxID:   [id number]   Patient Assist Medication Verification: Medication: Reyataz 300mg  Lot#[id number] Exp Date:Jan 2013 Tech approval:MLD                Patient Assist Medication Verification: Medication:Norvir 100mg  Lot# [id number] Exp Date:08 Aug 2011 Tech approval:MLD                Patient Assist Medication Verification: Medication: Truvada Lot# [id number] Exp Date:09 2014 Tech approval:MLD  Tried to contact patient. Was unable to leave a message at the time call was placed because patient had phone set to unavailability. Paulo Fruit  BS,CPht II,MPH  February 12, 2010 10:00 AM

## 2010-12-07 NOTE — Progress Notes (Signed)
Summary: Pt picking up ADAP meds  Phone Note Other Incoming   Caller: Pt walked into clinic Summary of Call: Pt picked up ADAP Meds. from 06-07-10 Tomasita Morrow RN  July 05, 2010 9:29 AM  Initial call taken by: Tomasita Morrow RN,  July 05, 2010 9:29 AM

## 2010-12-07 NOTE — Miscellaneous (Signed)
Summary: clinical update/ryan white [place] appr til 02/05/11  Clinical Lists Changes  Observations: Added new observation of AIDSDAP: Yes 2011 (01/12/2010 10:23)

## 2010-12-07 NOTE — Progress Notes (Signed)
Summary: [facility]ADAP/pt assist new med arrived for Feb  Phone Note Refill Request      Prescriptions: LOMOTIL 2.5-0.025 MG TABS (DIPHENOXYLATE-ATROPINE) take one tablet once or twice a day  #60 x 0   Entered by:   Paulo Fruit  BS,CPht II,MPH   Authorized by:   Yisroel Ramming MD   Signed by:   Paulo Fruit  BS,CPht II,MPH on 12/15/2009   Method used:   Samples Given   RxID:   [id number]  Patient Assist Medication Verification: Medication name: Diphenoxylate/Atropine 2.5mg /0.25mg  RX #  [id number] Tech approval:MLD Call placed to patient with message that assistance medications are ready for pick-up. Patient has an appt to renew for ADADP program on Wednesday, he will pick up then. Paulo Fruit  BS,CPht II,MPH  December 15, 2009 2:55 PM

## 2010-12-07 NOTE — Assessment & Plan Note (Signed)
Summary: F/U OV/VS   CC:  follow-up visit and lab results.  History of Present Illness: Pt here for f/u.  He states that he missed 5 doses of his HIV meds.  He c/o some low back pain.  No radiation of the pain.  Preventive Screening-Counseling & Management  Alcohol-Tobacco     Alcohol drinks/day: 3 every other weekend     Alcohol type: mixed drinks     Smoking Status: quit     Smoking Cessation Counseling: yes     Packs/Day: 3 CIGGS     Year Started: 2006     Year Quit: 2009     Cigars/week: 5     Passive Smoke Exposure: yes  Caffeine-Diet-Exercise     Caffeine use/day: 0     Does Patient Exercise: yes     Type of exercise: cardio     Exercise (avg: min/session): 30-60     Times/week: <3  Hep-HIV-STD-Contraception     HIV Risk: no  Safety-Violence-Falls     Seat Belt Use: yes      Sexual History:  no relationship.        Drug Use:  never.        Blood Transfusions:  no.        Travel History:  none.    Comments: pt. gjven condoms   Updated Prior Medication List: REYATAZ 300 MG  CAPS (ATAZANAVIR SULFATE) Take 1 tablet by mouth once a day TRUVADA 200-300 MG  TABS (EMTRICITABINE-TENOFOVIR) Take 1 tablet by mouth once a day * NIZORAL A-D 1 % CREAM (KETOCONAZOLE) apply two times a day NORVIR 100 MG TABS (RITONAVIR) Take 1 tablet by mouth once a day LOMOTIL 2.5-0.025 MG TABS (DIPHENOXYLATE-ATROPINE) take one tablet once or twice a day TRIAMCINOLONE ACETONIDE 0.1 % CREA (TRIAMCINOLONE ACETONIDE) apply two times a day as needed  Current Allergies (reviewed today): No known allergies  Review of Systems  The patient denies anorexia, fever, and weight loss.    Vital Signs:  Patient profile:   30 year old male Height:      69 inches (175.26 cm) Weight:      147.8 pounds (64.27 kg) BMI:     21.91 Temp:     98.0 degrees F (36.72 degrees C) oral Pulse rate:   62 / minute BP sitting:   137 / 79  (right arm)  Vitals Entered By: Wendall Mola CMA Duncan Dull)  (July 23, 2010 10:34 AM) CC: follow-up visit, lab results Is Patient Diabetic? No Pain Assessment Patient in pain? no      Nutritional Status BMI of 19 -24 = normal Nutritional Status Detail appetite "up and down"  Does patient need assistance? Functional Status Self care Ambulation Normal Comments pt. missed 5 doses of meds since last visit   Physical Exam  General:  alert, well-developed, well-nourished, and well-hydrated.   Head:  normocephalic and atraumatic.   Mouth:  pharynx pink and moist.   Lungs:  normal breath sounds.     Impression & Recommendations:  Problem # 1:  HIV INFECTION (ICD-042) VL up concerned about resistance.  Will obtain a genotype and have pt return in 3 weeks for results. Influenza vaccine given. Orders: T-HIV Genotype (57846-96295) Est. Patient Level III (28413)  Diagnostics Reviewed:  HIV: HIV positive - not AIDS (11/11/2009)   HIV-Western blot: Positive (05/02/2007)   CD4: 460 (07/06/2010)   WBC: 5.3 (07/05/2010)   Hgb: 12.9 (07/05/2010)   HCT: 42.6 (07/05/2010)   Platelets: 318 (07/05/2010) HIV  genotype: * (12/11/2009)   HIV-1 RNA: 94854 (07/05/2010)   HBSAg: NEG (05/02/2007)  Other Orders: Influenza Vaccine NON MCR (62703)  Patient Instructions: 1)  Please schedule a follow-up appointment in 3 weeks.   Immunizations Administered:  Influenza Vaccine # 1:    Vaccine Type: Fluvax Non-MCR    Site: left deltoid    Mfr: [employer]    Dose: 0.5 ml    Route: IM    Given by: Wendall Mola CMA ( AAMA)    Exp. Date: 02/06/2011    Lot #: [id number]    VIS given: 06/01/10 version given July 23, 2010.  Flu Vaccine Consent Questions:    Do you have a history of severe allergic reactions to this vaccine? no    Any prior history of allergic reactions to egg and/or gelatin? no    Do you have a sensitivity to the preservative Thimersol? no    Do you have a past history of Guillan-Barre Syndrome? no    Do you currently have an acute  febrile illness? no    Have you ever had a severe reaction to latex? no    Vaccine information given and explained to patient? yes

## 2010-12-07 NOTE — Miscellaneous (Signed)
Summary: Appointment No Show  Appointment status changed to no show by [employer] on 02/10/2010 4:36 PM.  No Show Comments ---------------- LABS/VS  Appointment Information ----------------------- Appt Type:  LAB NO DOCUMENT      Date:  Wednesday, February 10, 2010      Time:  9:30 AM for 30 min   Urgency:  Routine   Made By:  Pearson Grippe  To Visit:  [id number]    Reason:  LABS/VS  Appt Comments ------------- -- 02/10/10 16:36: ([facility]) NO SHOW -- LABS/VS -- 02/08/10 14:52: ([facility]) BOOKED -- Routine LAB NO DOCUMENT at 02/10/2010 9:30 AM for 30 min LABS/VS -- 02/08/10 14:52: ([facility]) BOOKED -- Routine LAB NO DOCUMENT at 02/10/2010 9:30 AM for 30 min LABS/VS -- 3/3

## 2010-12-07 NOTE — Progress Notes (Signed)
Summary: [facility]AD[facility]/pt assist meds arrived for May  Phone Note Refill Request      Prescriptions: NORVIR 100 MG TABS (RITONAVIR) Take 1 tablet by mouth once a day  #30 x 0   Entered by:   Paulo Fruit  BS,CPht II,MPH   Authorized by:   Yisroel Ramming MD   Signed by:   Paulo Fruit  BS,CPht II,MPH on 03/15/2010   Method used:   Samples Given   RxID:   [id number] TRUVADA 200-300 MG  TABS (EMTRICITABINE-TENOFOVIR) Take 1 tablet by mouth once a day  #30 x 0   Entered by:   Paulo Fruit  BS,CPht II,MPH   Authorized by:   Yisroel Ramming MD   Signed by:   Paulo Fruit  BS,CPht II,MPH on 03/15/2010   Method used:   Samples Given   RxID:   [id number] REYATAZ 300 MG  CAPS (ATAZANAVIR SULFATE) Take 1 tablet by mouth once a day  #30 x 0   Entered by:   Paulo Fruit  BS,CPht II,MPH   Authorized by:   Yisroel Ramming MD   Signed by:   Paulo Fruit  BS,CPht II,MPH on 03/15/2010   Method used:   Samples Given   RxID:   [id number]   Patient Assist Medication Verification: Medication: Reyataz 300mg  Lot#[id number] Exp Date:Feb 2013 Tech approval:MLD                Patient Assist Medication Verification: Medication:Norvir 100mg  QQV#[id number] Exp Date:08 Aug 2011 Tech approval:MLD                Patient Assist Medication Verification: Medication:truvada Lot# [id number] Exp Date:10 2014 Tech approval:MLD Call placed to patient with message that assistance medications are ready for pick-up. Left message on pt's voicemail Paulo Fruit  BS,CPht II,MPH  Mar 15, 2010 3:05 PM

## 2010-12-07 NOTE — Assessment & Plan Note (Signed)
Summary: 2WK F/U/VS   CC:  f/u labs.  History of Present Illness: Pt here for f/u.  He feels well. Missed 2 - 3 days of his HIV meds.  Preventive Screening-Counseling & Management  Alcohol-Tobacco     Alcohol drinks/day: 3 every other weekend     Alcohol type: mixed drinks     Smoking Status: current     Smoking Cessation Counseling: yes     Packs/Day: 3 CIGGS     Year Started: 2006     Year Quit: 2009     Cigars/week: 5     Passive Smoke Exposure: yes  Caffeine-Diet-Exercise     Caffeine use/day: 0     Does Patient Exercise: yes     Type of exercise: cardio     Exercise (avg: min/session): 30-60     Times/week: <3  Hep-HIV-STD-Contraception     HIV Risk: no  Safety-Violence-Falls     Seat Belt Use: yes      Sexual History:  no relationship.        Drug Use:  never.        Blood Transfusions:  no.        Travel History:  none.     Updated Prior Medication List: REYATAZ 300 MG  CAPS (ATAZANAVIR SULFATE) Take 1 tablet by mouth once a day TRUVADA 200-300 MG  TABS (EMTRICITABINE-TENOFOVIR) Take 1 tablet by mouth once a day * NIZORAL A-D 1 % CREAM (KETOCONAZOLE) apply two times a day NORVIR 100 MG TABS (RITONAVIR) Take 1 tablet by mouth once a day  Current Allergies (reviewed today): No known allergies  Review of Systems  The patient denies anorexia, fever, and weight loss.    Vital Signs:  Patient profile:   30 year old male Height:      69 inches (175.26 cm) Weight:      158.56 pounds (72.07 kg) BMI:     23.50 Temp:     97.0 degrees F (36.11 degrees C) oral Pulse rate:   71 / minute BP sitting:   135 / 80  (right arm)  Vitals Entered By: Starleen Arms CMA (November 11, 2009 9:34 AM) CC: f/u labs Is Patient Diabetic? No Pain Assessment Patient in pain? no      Nutritional Status BMI of 19 -24 = normal Nutritional Status Detail nl  Does patient need assistance? Functional Status Self care Ambulation Normal   Physical Exam  General:   alert, well-developed, well-nourished, and well-hydrated.   Head:  normocephalic and atraumatic.          Medication Adherence: 11/11/2009   Adherence to medications reviewed with patient. Counseling to provide adequate adherence provided                                Impression & Recommendations:  Problem # 1:  HIV INFECTION (ICD-042) VL up slightly hopefully not dut to developing resistance.  I encouraged him to take his meds every day. Will repeat labs in 3 months. Diagnostics Reviewed:  HIV: REACTIVE (05/02/2007)   HIV-Western blot: Positive (05/02/2007)   CD4: 460 (10/22/2009)   WBC: 5.1 (10/21/2009)   Hgb: 14.2 (10/21/2009)   HCT: 43.8 (10/21/2009)   Platelets: 317 (10/21/2009) HIV-1 RNA: 641 (10/21/2009)   HBSAg: NEG (05/02/2007)  Orders: Est. Patient Level III (99213)Future Orders: T-CD4SP ([facility]) (CD4SP) ... 02/09/2010 T-HIV Viral Load [phone]) ... 02/09/2010 T-Comprehensive Metabolic Panel [phone]) ... 02/09/2010 T-CBC w/Diff (  [id number]) ... 02/09/2010 T-RPR (Syphilis) [phone]) ... 02/09/2010  Patient Instructions: 1)  Please schedule a follow-up appointment in 3 months, 2 weeks after labs.  Process Orders Check Orders Results:     [facility]: ABN not required for this insurance Tests Sent for requisitioning (November 11, 2009 9:50 AM):     02/09/2010: [facility] -- T-HIV Viral Load [phone] (signed)     02/09/2010: [facility] -- T-Comprehensive Metabolic Panel [[id number]] (signed)     02/09/2010: [facility] -- T-CBC w/Diff [[id number]] (signed)     02/09/2010: [facility] -- T-RPR (Syphilis) [phone] (signed)         Medication Adherence: 11/11/2009   Adherence to medications reviewed with patient. Counseling to provide adequate adherence provided

## 2011-01-03 ENCOUNTER — Encounter: Payer: Self-pay | Admitting: Adult Health

## 2011-01-03 ENCOUNTER — Other Ambulatory Visit: Payer: Self-pay | Admitting: Adult Health

## 2011-01-03 ENCOUNTER — Other Ambulatory Visit (INDEPENDENT_AMBULATORY_CARE_PROVIDER_SITE_OTHER): Payer: Self-pay

## 2011-01-03 DIAGNOSIS — B20 Human immunodeficiency virus [HIV] disease: Secondary | ICD-10-CM

## 2011-01-03 LAB — CONVERTED CEMR LAB
ALT: 12 units/L (ref 0–53)
AST: 18 units/L (ref 0–37)
Albumin: 4 g/dL (ref 3.5–5.2)
Alkaline Phosphatase: 61 units/L (ref 39–117)
BUN: 8 mg/dL (ref 6–23)
Basophils Absolute: 0 10*3/uL (ref 0.0–0.1)
Basophils Relative: 1 % (ref 0–1)
Chloride: 102 meq/L (ref 96–112)
Creatinine, Ser: 0.88 mg/dL (ref 0.40–1.50)
Eosinophils Relative: 6 % — ABNORMAL HIGH (ref 0–5)
HCT: 40.8 % (ref 39.0–52.0)
HIV 1 RNA Quant: 7480 copies/mL — ABNORMAL HIGH (ref <20)
Hemoglobin: 12.9 g/dL — ABNORMAL LOW (ref 13.0–17.0)
MCHC: 31.6 g/dL (ref 30.0–36.0)
Monocytes Absolute: 0.3 10*3/uL (ref 0.1–1.0)
Platelets: 282 10*3/uL (ref 150–400)
Potassium: 4.5 meq/L (ref 3.5–5.3)
RDW: 13.3 % (ref 11.5–15.5)

## 2011-01-04 LAB — T-HELPER CELL (CD4) - (RCID CLINIC ONLY)
CD4 % Helper T Cell: 28 % — ABNORMAL LOW (ref 33–55)
CD4 T Cell Abs: 350 uL — ABNORMAL LOW (ref 400–2700)

## 2011-01-05 ENCOUNTER — Encounter: Payer: Self-pay | Admitting: Adult Health

## 2011-01-06 ENCOUNTER — Encounter (INDEPENDENT_AMBULATORY_CARE_PROVIDER_SITE_OTHER): Payer: Self-pay | Admitting: *Deleted

## 2011-01-07 ENCOUNTER — Encounter (INDEPENDENT_AMBULATORY_CARE_PROVIDER_SITE_OTHER): Payer: Self-pay | Admitting: *Deleted

## 2011-01-13 NOTE — Miscellaneous (Signed)
  Clinical Lists Changes 

## 2011-01-13 NOTE — Miscellaneous (Addendum)
  Clinical Lists Changes  Orders: Added new Test order of T-HIV Genotype [phone]) - Signed

## 2011-01-13 NOTE — Miscellaneous (Signed)
  Clinical Lists Changes  Observations: Added new observation of PCTFPL: 104.75  (01/06/2011 15:04) Added new observation of AIDSDAP: PENDING APPROVAL 2012  (01/06/2011 15:04) Added new observation of FINASSESSDT: [id number]  (01/06/2011 15:04) Added new observation of HOUSEINCOME: 64332  (01/06/2011 15:04)

## 2011-01-17 ENCOUNTER — Ambulatory Visit (INDEPENDENT_AMBULATORY_CARE_PROVIDER_SITE_OTHER): Payer: Self-pay | Admitting: Infectious Diseases

## 2011-01-17 ENCOUNTER — Ambulatory Visit: Payer: Self-pay | Admitting: Infectious Diseases

## 2011-01-17 DIAGNOSIS — B20 Human immunodeficiency virus [HIV] disease: Secondary | ICD-10-CM

## 2011-01-25 LAB — T-HELPER CELL (CD4) - (RCID CLINIC ONLY)
CD4 % Helper T Cell: 29 % — ABNORMAL LOW (ref 33–55)
CD4 T Cell Abs: 460 uL (ref 400–2700)

## 2011-01-26 ENCOUNTER — Encounter (INDEPENDENT_AMBULATORY_CARE_PROVIDER_SITE_OTHER): Payer: Self-pay | Admitting: *Deleted

## 2011-01-26 LAB — T-HELPER CELL (CD4) - (RCID CLINIC ONLY)
CD4 % Helper T Cell: 31 % — ABNORMAL LOW (ref 33–55)
CD4 T Cell Abs: 380 uL — ABNORMAL LOW (ref 400–2700)

## 2011-02-03 NOTE — Miscellaneous (Signed)
Summary: ADAP APPROVED TIL 08/07/11  Clinical Lists Changes  Observations: Added new observation of AIDSDAP: Yes 2012 (01/26/2011 11:05)

## 2011-02-07 ENCOUNTER — Ambulatory Visit: Payer: Self-pay | Admitting: Infectious Diseases

## 2011-02-08 LAB — T-HELPER CELL (CD4) - (RCID CLINIC ONLY): CD4 T Cell Abs: 460 uL (ref 400–2700)

## 2011-02-16 LAB — T-HELPER CELL (CD4) - (RCID CLINIC ONLY): CD4 T Cell Abs: 380 uL — ABNORMAL LOW (ref 400–2700)

## 2011-03-16 ENCOUNTER — Other Ambulatory Visit: Payer: Self-pay

## 2011-03-18 ENCOUNTER — Other Ambulatory Visit: Payer: Self-pay | Admitting: Adult Health

## 2011-03-18 ENCOUNTER — Other Ambulatory Visit (INDEPENDENT_AMBULATORY_CARE_PROVIDER_SITE_OTHER): Payer: Self-pay

## 2011-03-18 ENCOUNTER — Other Ambulatory Visit: Payer: Self-pay | Admitting: Infectious Diseases

## 2011-03-18 DIAGNOSIS — B20 Human immunodeficiency virus [HIV] disease: Secondary | ICD-10-CM

## 2011-03-18 LAB — T-HELPER CELL (CD4) - (RCID CLINIC ONLY)
CD4 % Helper T Cell: 27 % — ABNORMAL LOW (ref 33–55)
CD4 T Cell Abs: 410 uL (ref 400–2700)

## 2011-03-19 LAB — CBC WITH DIFFERENTIAL/PLATELET
Eosinophils Absolute: 0.3 10*3/uL (ref 0.0–0.7)
Hemoglobin: 13.3 g/dL (ref 13.0–17.0)
Lymphs Abs: 1.5 10*3/uL (ref 0.7–4.0)
MCH: 27.4 pg (ref 26.0–34.0)
Neutro Abs: 1.9 10*3/uL (ref 1.7–7.7)
Neutrophils Relative %: 48 % (ref 43–77)
Platelets: 315 10*3/uL (ref 150–400)
RBC: 4.85 MIL/uL (ref 4.22–5.81)
WBC: 3.9 10*3/uL — ABNORMAL LOW (ref 4.0–10.5)

## 2011-03-19 LAB — COMPREHENSIVE METABOLIC PANEL
ALT: 12 U/L (ref 0–53)
AST: 17 U/L (ref 0–37)
Albumin: 4.1 g/dL (ref 3.5–5.2)
Alkaline Phosphatase: 59 U/L (ref 39–117)
BUN: 9 mg/dL (ref 6–23)
Creat: 0.99 mg/dL (ref 0.40–1.50)
Potassium: 4.4 mEq/L (ref 3.5–5.3)

## 2011-03-22 LAB — HIV-1 RNA QUANT-NO REFLEX-BLD
HIV 1 RNA Quant: 32400 copies/mL — ABNORMAL HIGH (ref <20)
HIV-1 RNA Quant, Log: 4.51 {Log} — ABNORMAL HIGH (ref <1.30)

## 2011-03-30 ENCOUNTER — Ambulatory Visit (INDEPENDENT_AMBULATORY_CARE_PROVIDER_SITE_OTHER): Payer: Self-pay | Admitting: Infectious Diseases

## 2011-03-30 ENCOUNTER — Encounter: Payer: Self-pay | Admitting: Infectious Diseases

## 2011-03-30 DIAGNOSIS — A539 Syphilis, unspecified: Secondary | ICD-10-CM

## 2011-03-30 DIAGNOSIS — Z23 Encounter for immunization: Secondary | ICD-10-CM

## 2011-03-30 DIAGNOSIS — Z79899 Other long term (current) drug therapy: Secondary | ICD-10-CM

## 2011-03-30 DIAGNOSIS — B20 Human immunodeficiency virus [HIV] disease: Secondary | ICD-10-CM

## 2011-03-30 NOTE — Assessment & Plan Note (Signed)
Will repeat his RPR at f/u visit.

## 2011-03-30 NOTE — Progress Notes (Signed)
Addended by: Wendall Mola on: 03/30/2011 12:37 PM   Modules accepted: Orders

## 2011-03-30 NOTE — Assessment & Plan Note (Signed)
His CD4 is in a good range however he is not taking his ART. He is given a pill box to see if this can help him with his adherence. He is given condoms. He will start Hep A vax series today. He will return to clinic in 3- 4 months with labs prior including a genotype.

## 2011-03-30 NOTE — Progress Notes (Signed)
  Subjective:    Patient ID: Donald Dean, male    DOB: [birth year], 30 y.o.   MRN: [id number]  HPI 30 yo M with HIV+ dx 2001 on a routine test, last CD4 410 and VL  32,400 (03-18-11). He had a naive genotype 07-2010. States he is feeling great.  Has been missing his meds, missed an entire week.   Review of Systems  Constitutional: Negative for appetite change and unexpected weight change.  Gastrointestinal: Negative for diarrhea and constipation.  Genitourinary: Negative for difficulty urinating.       Objective:   Physical Exam  Constitutional: He appears well-developed and well-nourished.  Eyes: EOM are normal. Pupils are equal, round, and reactive to light.  Neck: Neck supple.  Cardiovascular: Normal rate, regular rhythm and normal heart sounds.   Pulmonary/Chest: Effort normal and breath sounds normal. No respiratory distress.  Abdominal: Soft. Bowel sounds are normal. He exhibits no distension.  Lymphadenopathy:    He has cervical adenopathy.          Assessment & Plan:

## 2011-04-11 ENCOUNTER — Other Ambulatory Visit: Payer: Self-pay | Admitting: *Deleted

## 2011-04-11 DIAGNOSIS — B20 Human immunodeficiency virus [HIV] disease: Secondary | ICD-10-CM

## 2011-04-11 MED ORDER — EMTRICITABINE-TENOFOVIR DF 200-300 MG PO TABS: 1.0000 | ORAL_TABLET | Freq: Every day | ORAL | Status: DC

## 2011-04-11 MED ORDER — ATAZANAVIR SULFATE 300 MG PO CAPS: 300.0000 mg | ORAL_CAPSULE | Freq: Every day | ORAL | Status: DC

## 2011-04-11 MED ORDER — RITONAVIR 100 MG PO TABS: 100.0000 mg | ORAL_TABLET | Freq: Every day | ORAL | Status: DC

## 2011-04-15 ENCOUNTER — Other Ambulatory Visit: Payer: Self-pay | Admitting: *Deleted

## 2011-04-15 DIAGNOSIS — B20 Human immunodeficiency virus [HIV] disease: Secondary | ICD-10-CM

## 2011-04-15 MED ORDER — ATAZANAVIR SULFATE 300 MG PO CAPS: 300.0000 mg | ORAL_CAPSULE | Freq: Every day | ORAL | Status: DC

## 2011-04-15 MED ORDER — EMTRICITABINE-TENOFOVIR DF 200-300 MG PO TABS: 1.0000 | ORAL_TABLET | Freq: Every day | ORAL | Status: DC

## 2011-04-15 MED ORDER — RITONAVIR 100 MG PO TABS: 100.0000 mg | ORAL_TABLET | Freq: Every day | ORAL | Status: DC

## 2011-06-20 ENCOUNTER — Ambulatory Visit: Payer: Self-pay

## 2011-06-29 ENCOUNTER — Ambulatory Visit: Payer: Self-pay

## 2011-07-27 ENCOUNTER — Other Ambulatory Visit (INDEPENDENT_AMBULATORY_CARE_PROVIDER_SITE_OTHER): Payer: Self-pay

## 2011-07-27 ENCOUNTER — Other Ambulatory Visit: Payer: Self-pay | Admitting: Infectious Disease

## 2011-07-27 DIAGNOSIS — B20 Human immunodeficiency virus [HIV] disease: Secondary | ICD-10-CM

## 2011-07-27 DIAGNOSIS — A539 Syphilis, unspecified: Secondary | ICD-10-CM

## 2011-07-27 DIAGNOSIS — Z79899 Other long term (current) drug therapy: Secondary | ICD-10-CM

## 2011-07-28 LAB — COMPREHENSIVE METABOLIC PANEL
ALT: 12 U/L (ref 0–53)
AST: 22 U/L (ref 0–37)
Albumin: 4.3 g/dL (ref 3.5–5.2)
Alkaline Phosphatase: 59 U/L (ref 39–117)
BUN: 11 mg/dL (ref 6–23)
Potassium: 4.1 mEq/L (ref 3.5–5.3)

## 2011-07-28 LAB — LIPID PANEL
HDL: 55 mg/dL (ref >39)
LDL Cholesterol: 41 mg/dL (ref 0–99)
Total CHOL/HDL Ratio: 2 Ratio

## 2011-07-28 LAB — CBC
HCT: 42.9 % (ref 39.0–52.0)
Platelets: 337 10*3/uL (ref 150–400)
RBC: 4.97 MIL/uL (ref 4.22–5.81)
RDW: 13.8 % (ref 11.5–15.5)
WBC: 5.3 10*3/uL (ref 4.0–10.5)

## 2011-07-29 LAB — T-HELPER CELL (CD4) - (RCID CLINIC ONLY): CD4 % Helper T Cell: 25 — ABNORMAL LOW

## 2011-08-05 LAB — HIV-1 GENOTYPR PLUS

## 2011-08-10 ENCOUNTER — Ambulatory Visit: Payer: Self-pay | Admitting: Infectious Diseases

## 2011-08-10 LAB — T-HELPER CELL (CD4) - (RCID CLINIC ONLY): CD4 T Cell Abs: 580

## 2011-08-11 ENCOUNTER — Ambulatory Visit (INDEPENDENT_AMBULATORY_CARE_PROVIDER_SITE_OTHER): Payer: Self-pay | Admitting: Infectious Diseases

## 2011-08-11 ENCOUNTER — Encounter: Payer: Self-pay | Admitting: Infectious Diseases

## 2011-08-11 ENCOUNTER — Ambulatory Visit (HOSPITAL_COMMUNITY)
Admission: RE | Admit: 2011-08-11 | Discharge: 2011-08-11 | Disposition: A | Discharge status: Home / Self Care | Payer: Self-pay | Source: Ambulatory Visit | Attending: Infectious Diseases | Admitting: Infectious Diseases

## 2011-08-11 ENCOUNTER — Other Ambulatory Visit: Payer: Self-pay | Admitting: *Deleted

## 2011-08-11 DIAGNOSIS — R21 Rash and other nonspecific skin eruption: Secondary | ICD-10-CM

## 2011-08-11 DIAGNOSIS — R9431 Abnormal electrocardiogram [ECG] [EKG]: Secondary | ICD-10-CM | POA: Insufficient documentation

## 2011-08-11 DIAGNOSIS — Z23 Encounter for immunization: Secondary | ICD-10-CM

## 2011-08-11 DIAGNOSIS — R079 Chest pain, unspecified: Secondary | ICD-10-CM | POA: Insufficient documentation

## 2011-08-11 DIAGNOSIS — B20 Human immunodeficiency virus [HIV] disease: Secondary | ICD-10-CM

## 2011-08-11 MED ORDER — ASPIRIN 81 MG PO CHEW: 81.0000 mg | CHEWABLE_TABLET | Freq: Every day | ORAL | Status: DC

## 2011-08-11 NOTE — Progress Notes (Signed)
  Subjective:    Patient ID: Donald Dean, male    DOB: Oct 18, 1981, 30 y.o.   MRN: [id number]  HPI 30 yo M with HIV+ dx 2001 on a routine test, last CD4 540 and VL 17,900 (07-27-11). He had a naive genotype 07-2010. He was previously started on ATVr/TRV but has been non-adherent. He was given a pill box as well.  Has been feeling well. Has been taking his meds, states he has only missed it a few times.  Has been rashes on his arms. Was seen at health dept (for STD testing) and etiology unclear.     Review of Systems  Constitutional: Negative for appetite change and unexpected weight change.  Cardiovascular: Positive for chest pain.       Feels like he is being stabbed from the inside. Unchanged with eating. Has it all day. Is slightly more worse with exertion. No excess belching or halotosis.   Gastrointestinal: Negative for diarrhea and constipation.  Genitourinary: Negative for dysuria.  Skin: Positive for rash.  Hematological: Negative for adenopathy. Does not bruise/bleed easily.       Objective:   Physical Exam  Constitutional: He appears well-developed and well-nourished.  Eyes: EOM are normal. Pupils are equal, round, and reactive to light.  Neck: Neck supple.  Cardiovascular: Normal rate, regular rhythm and normal heart sounds.   Pulmonary/Chest: Effort normal and breath sounds normal. He exhibits no tenderness.  Abdominal: Soft. Bowel sounds are normal. There is no tenderness.  Lymphadenopathy:    He has no cervical adenopathy.  Skin: Rash noted.       Faint, mild papular rash on UE.           Assessment & Plan:

## 2011-08-11 NOTE — Assessment & Plan Note (Addendum)
He denies typical sx of GERD. He does not have FHx CAD. He is a non-smoker. His Cholesterol was 111 (07-27-11). He does state that the pain sometimes radiates into his neck. His ECG is NSR 67 minimal voltage criteria for LVH, septal infarct age indeterminant.. Will send for GXT

## 2011-08-11 NOTE — Assessment & Plan Note (Signed)
Advised pt to use moisturizing agents. He is using dove soap.

## 2011-08-11 NOTE — Progress Notes (Signed)
Addended by: Wendall Mola A on: 08/11/2011 04:51 PM   Modules accepted: Orders

## 2011-08-11 NOTE — Assessment & Plan Note (Addendum)
Will get 2nd hep A at next visit. He will get flu shot today. Given condoms. Await his genotype from lab. He either has resistance or is not taking his meds. His genotype is naive.  Given rx for stribild (not in [facility]).

## 2011-08-12 LAB — T-HELPER CELL (CD4) - (RCID CLINIC ONLY): CD4 % Helper T Cell: 27 % — ABNORMAL LOW (ref 33–55)

## 2011-08-17 LAB — T-HELPER CELL (CD4) - (RCID CLINIC ONLY): CD4 T Cell Abs: 360 — ABNORMAL LOW

## 2011-08-19 LAB — T-HELPER CELL (CD4) - (RCID CLINIC ONLY): CD4 % Helper T Cell: 17 — ABNORMAL LOW

## 2011-08-23 ENCOUNTER — Ambulatory Visit (INDEPENDENT_AMBULATORY_CARE_PROVIDER_SITE_OTHER): Payer: Self-pay | Admitting: Cardiology

## 2011-08-23 ENCOUNTER — Ambulatory Visit: Payer: Self-pay

## 2011-08-23 ENCOUNTER — Encounter: Payer: Self-pay | Admitting: Cardiology

## 2011-08-23 VITALS — BP 130/72 | HR 53 | Ht 69.0 in | Wt 155.8 lb

## 2011-08-23 DIAGNOSIS — R079 Chest pain, unspecified: Secondary | ICD-10-CM

## 2011-08-23 DIAGNOSIS — Z21 Asymptomatic human immunodeficiency virus [HIV] infection status: Secondary | ICD-10-CM

## 2011-08-23 DIAGNOSIS — B20 Human immunodeficiency virus [HIV] disease: Secondary | ICD-10-CM

## 2011-08-23 DIAGNOSIS — R9431 Abnormal electrocardiogram [ECG] [EKG]: Secondary | ICD-10-CM

## 2011-08-23 NOTE — Assessment & Plan Note (Signed)
His chest pain is atypical. He has no cardiac risk factors other than a remote family history of coronary disease. Given his history of HIV infection I think it is appropriate to rule out pericardial disease or left ventricular dysfunction. We will obtain an echocardiogram. If this is normal then I think we can reassure him from a cardiac standpoint.

## 2011-08-23 NOTE — Patient Instructions (Signed)
We will schedule you for an echocardiogram. We will call with the results.  Take Aleve or advil for pain.

## 2011-08-23 NOTE — Progress Notes (Signed)
   Donald Dean Date of Birth: Dec 22, 1980 Medical Record #[id number]  History of Present Illness: Donald Dean is a pleasant 30 year old African American male who is seen at the request of Dr. Ninetta Lights for evaluation of chest pain. He has a history of HIV infection for the past 10 years that has been controlled with antiviral therapy. He reports that over the past year he has had symptoms of chest pain. This is described as a sharp stabbing pain initiating below the left breast and radiating to the left precordium. It comes and goes. It typically lasts less than 5 minutes. There are no clear aggravating factors. He notes no change with position, deep breathing, or cough. He does not seem to be affected by exertion. He is concerned because these symptoms are occurring more often and are now occurring daily. The pain has not changed in severity. He has had no fever or chills. His weight has been stable.  Current Outpatient Prescriptions on File Prior to Visit  Medication Sig Dispense Refill  . aspirin (ASPIRIN CHILDRENS) 81 MG chewable tablet Chew 1 tablet (81 mg total) by mouth daily.  36 tablet  11    No Known Allergies  Past Medical History  Diagnosis Date  . HIV infection   . Chest pain   . SOB (shortness of breath)     History reviewed. No pertinent past surgical history.  History  Smoking status  . Never Smoker   Smokeless tobacco  . Never Used    History  Alcohol Use  . 1.0 oz/week  . 2 drink(s) per week    occasional    History reviewed. No pertinent family history.  Review of Systems: As noted in history of present illness..  All other systems were reviewed and are negative.  Physical Exam: BP 130/72  Pulse 53  Ht 5\' 9"  (1.753 m)  Wt 155 lb 12.8 oz (70.67 kg)  BMI 23.01 kg/m2 He is a well-developed black male in no acute distress.The patient is alert and oriented x 3.  The mood and affect are normal.  The skin is warm and dry.  Color is normal.  The HEENT exam reveals  that the sclera are nonicteric.  The mucous membranes are moist.  The carotids are 2+ without bruits.  There is no thyromegaly.  There is no JVD.  The lungs are clear.  The chest wall is non tender.  The heart exam reveals a regular rate with a normal S1 and S2.  There are no murmurs, gallops, or rubs.  The PMI is not displaced.   Abdominal exam reveals good bowel sounds.  There is no guarding or rebound.  There is no hepatosplenomegaly or tenderness.  There are no masses.  Exam of the legs reveal no clubbing, cyanosis, or edema.  The legs are without rashes.  The distal pulses are intact.  Cranial nerves II - XII are intact.  Motor and sensory functions are intact.  The gait is normal.  LABORATORY DATA: ECG demonstrates normal sinus rhythm with LVH pattern. He has repolarization abnormality in the inferior leads.  Assessment / Plan:

## 2011-08-24 LAB — T-HELPER CELLS (CD4) COUNT (NOT AT ARMC)
CD4 % Helper T Cell: 16 — ABNORMAL LOW
CD4 T Cell Abs: 300 — ABNORMAL LOW

## 2011-08-24 LAB — T-HELPER CELL (CD4) - (RCID CLINIC ONLY): CD4 T Cell Abs: 330 — ABNORMAL LOW

## 2011-08-31 ENCOUNTER — Other Ambulatory Visit (HOSPITAL_COMMUNITY): Payer: Self-pay | Admitting: Radiology

## 2011-08-31 ENCOUNTER — Ambulatory Visit (HOSPITAL_COMMUNITY): Payer: Self-pay | Attending: Cardiology | Admitting: Radiology

## 2011-08-31 ENCOUNTER — Telehealth: Payer: Self-pay | Admitting: *Deleted

## 2011-08-31 ENCOUNTER — Ambulatory Visit: Payer: Self-pay

## 2011-08-31 ENCOUNTER — Other Ambulatory Visit: Payer: Self-pay | Admitting: *Deleted

## 2011-08-31 DIAGNOSIS — A539 Syphilis, unspecified: Secondary | ICD-10-CM | POA: Insufficient documentation

## 2011-08-31 DIAGNOSIS — I379 Nonrheumatic pulmonary valve disorder, unspecified: Secondary | ICD-10-CM | POA: Insufficient documentation

## 2011-08-31 DIAGNOSIS — B20 Human immunodeficiency virus [HIV] disease: Secondary | ICD-10-CM

## 2011-08-31 DIAGNOSIS — R072 Precordial pain: Secondary | ICD-10-CM | POA: Insufficient documentation

## 2011-08-31 DIAGNOSIS — I079 Rheumatic tricuspid valve disease, unspecified: Secondary | ICD-10-CM | POA: Insufficient documentation

## 2011-08-31 MED ORDER — RITONAVIR 100 MG PO CAPS: 100.0000 mg | ORAL_CAPSULE | Freq: Every day | ORAL | Status: DC

## 2011-08-31 MED ORDER — ATAZANAVIR SULFATE 100 MG PO CAPS: 100.0000 mg | ORAL_CAPSULE | Freq: Every day | ORAL | Status: DC

## 2011-08-31 NOTE — Telephone Encounter (Signed)
Lm w/echo results

## 2011-08-31 NOTE — Telephone Encounter (Signed)
Message copied by Lorayne Bender on Wed Aug 31, 2011 11:49 AM ------      Message from: [place], PETER M      Created: Wed Aug 31, 2011 10:36 AM       Echo is normal. No concerns from a cardiac standpoint. Please reassure.      Theron Arista [place]

## 2011-09-05 ENCOUNTER — Ambulatory Visit (INDEPENDENT_AMBULATORY_CARE_PROVIDER_SITE_OTHER): Payer: Self-pay | Admitting: Infectious Diseases

## 2011-09-05 ENCOUNTER — Encounter: Payer: Self-pay | Admitting: Infectious Diseases

## 2011-09-05 ENCOUNTER — Other Ambulatory Visit: Payer: Self-pay | Admitting: Infectious Diseases

## 2011-09-05 VITALS — BP 134/80 | HR 59 | Temp 98.2°F | Ht 69.0 in | Wt 155.0 lb

## 2011-09-05 DIAGNOSIS — B20 Human immunodeficiency virus [HIV] disease: Secondary | ICD-10-CM

## 2011-09-05 DIAGNOSIS — Z113 Encounter for screening for infections with a predominantly sexual mode of transmission: Secondary | ICD-10-CM

## 2011-09-05 DIAGNOSIS — R079 Chest pain, unspecified: Secondary | ICD-10-CM

## 2011-09-05 MED ORDER — EMTRICITABINE-TENOFOVIR DF 200-300 MG PO TABS: 1.0000 | ORAL_TABLET | Freq: Every day | ORAL | Status: DC

## 2011-09-05 NOTE — Assessment & Plan Note (Signed)
This has been eval and is non-cardiac. He has stopped asa. Will watch.

## 2011-09-05 NOTE — Progress Notes (Signed)
Addended by: Mariea Clonts D on: 09/05/2011 04:37 PM   Modules accepted: Orders

## 2011-09-05 NOTE — Assessment & Plan Note (Signed)
Will continue on his current rx until his ADAP is approved and he can switch to complera. Gets condoms today. Vaccines are up to date. Needs 2nd Hep A at next visit. rtc 3 months.

## 2011-09-05 NOTE — Progress Notes (Signed)
  Subjective:    Patient ID: Donald Dean, male    DOB: 09-13-1981, 30 y.o.   MRN: [id number]  HPI 30 yo M with HIV+ dx 2001 on a routine test, last CD4 540 and VL 17,900 (07-27-11). He had a naive genotype 07-2010. He was previously started on ATVr/TRV but has been non-adherent. He was given a pill box as well.  Last CD4 540 and VL 17,900, genotype naive (07-27-11). States today that he has been taking his ART. Has recently updated his ADAP to change to complera. Been feeling well.    Review of Systems  Constitutional: Negative for appetite change and unexpected weight change.  Respiratory: Negative for choking and shortness of breath.   Gastrointestinal: Negative for nausea, vomiting, diarrhea and constipation.  Genitourinary: Negative for dysuria.       Objective:   Physical Exam  Constitutional: He appears well-developed and well-nourished.  Eyes: EOM are normal. Pupils are equal, round, and reactive to light.  Neck: Neck supple.  Cardiovascular: Normal rate, regular rhythm and normal heart sounds.   Pulmonary/Chest: Effort normal and breath sounds normal.  Abdominal: Soft. Bowel sounds are normal. There is no tenderness.  Lymphadenopathy:    He has no cervical adenopathy.          Assessment & Plan:

## 2011-09-06 LAB — GC/CHLAMYDIA PROBE AMP, URINE: Chlamydia, Swab/Urine, PCR: NEGATIVE

## 2011-09-08 ENCOUNTER — Ambulatory Visit: Payer: Self-pay | Admitting: Internal Medicine

## 2011-09-28 ENCOUNTER — Other Ambulatory Visit: Payer: Self-pay

## 2011-09-28 DIAGNOSIS — B20 Human immunodeficiency virus [HIV] disease: Secondary | ICD-10-CM

## 2011-09-28 MED ORDER — ATAZANAVIR SULFATE 100 MG PO CAPS: 100.0000 mg | ORAL_CAPSULE | Freq: Every day | ORAL | Status: DC

## 2011-09-28 MED ORDER — RITONAVIR 100 MG PO CAPS: 100.0000 mg | ORAL_CAPSULE | Freq: Every day | ORAL | Status: DC

## 2011-09-28 MED ORDER — EMTRICITABINE-TENOFOVIR DF 200-300 MG PO TABS: 1.0000 | ORAL_TABLET | Freq: Every day | ORAL | Status: DC

## 2011-11-14 ENCOUNTER — Other Ambulatory Visit: Payer: Self-pay | Admitting: Infectious Diseases

## 2011-11-14 ENCOUNTER — Other Ambulatory Visit: Payer: Self-pay

## 2011-11-14 DIAGNOSIS — B20 Human immunodeficiency virus [HIV] disease: Secondary | ICD-10-CM

## 2011-11-15 ENCOUNTER — Other Ambulatory Visit: Payer: Self-pay

## 2011-11-15 ENCOUNTER — Other Ambulatory Visit (INDEPENDENT_AMBULATORY_CARE_PROVIDER_SITE_OTHER): Payer: Self-pay

## 2011-11-15 ENCOUNTER — Other Ambulatory Visit: Payer: Self-pay | Admitting: Infectious Diseases

## 2011-11-15 DIAGNOSIS — B20 Human immunodeficiency virus [HIV] disease: Secondary | ICD-10-CM

## 2011-11-16 LAB — COMPREHENSIVE METABOLIC PANEL
ALT: 11 U/L (ref 0–53)
Alkaline Phosphatase: 69 U/L (ref 39–117)
CO2: 30 mEq/L (ref 19–32)
Sodium: 138 mEq/L (ref 135–145)
Total Bilirubin: 1.8 mg/dL — ABNORMAL HIGH (ref 0.3–1.2)
Total Protein: 7.6 g/dL (ref 6.0–8.3)

## 2011-11-16 LAB — CBC WITH DIFFERENTIAL/PLATELET
Basophils Absolute: 0 10*3/uL (ref 0.0–0.1)
Eosinophils Absolute: 0.2 10*3/uL (ref 0.0–0.7)
Eosinophils Relative: 5 % (ref 0–5)
MCH: 27.5 pg (ref 26.0–34.0)
MCHC: 32.1 g/dL (ref 30.0–36.0)
MCV: 85.7 fL (ref 78.0–100.0)
Platelets: 330 10*3/uL (ref 150–400)
RDW: 13.4 % (ref 11.5–15.5)

## 2011-11-17 LAB — HIV-1 RNA QUANT-NO REFLEX-BLD: HIV-1 RNA Quant, Log: 4.8 {Log} — ABNORMAL HIGH (ref <1.30)

## 2011-11-30 ENCOUNTER — Ambulatory Visit (INDEPENDENT_AMBULATORY_CARE_PROVIDER_SITE_OTHER): Payer: Self-pay | Admitting: Infectious Diseases

## 2011-11-30 ENCOUNTER — Encounter: Payer: Self-pay | Admitting: Infectious Diseases

## 2011-11-30 VITALS — BP 135/74 | HR 54 | Temp 98.2°F | Ht 69.0 in | Wt 155.0 lb

## 2011-11-30 DIAGNOSIS — Z79899 Other long term (current) drug therapy: Secondary | ICD-10-CM

## 2011-11-30 DIAGNOSIS — Z23 Encounter for immunization: Secondary | ICD-10-CM

## 2011-11-30 DIAGNOSIS — B20 Human immunodeficiency virus [HIV] disease: Secondary | ICD-10-CM

## 2011-11-30 DIAGNOSIS — Z0000 Encounter for general adult medical examination without abnormal findings: Secondary | ICD-10-CM

## 2011-11-30 MED ORDER — EMTRICITAB-RILPIVIR-TENOFOV DF 200-25-300 MG PO TABS: 1.0000 | ORAL_TABLET | Freq: Every day | ORAL | Status: DC

## 2011-11-30 NOTE — Assessment & Plan Note (Addendum)
Will change his ART to complera. rec to take with food. He has gotten fluvax. Gets Hep A vax today. Given condoms also. rtc 4 months.

## 2011-11-30 NOTE — Progress Notes (Signed)
  Subjective:    Patient ID: Donald Dean, male    DOB: 1980-11-17, 31 y.o.   MRN: [id number]  HPI 31 yo M with HIV+ dx 2001 on a routine test. He had a naive genotype 07-2010. He was previously started on ATVr/TRV but was non-adherent. He was given a pill box as well.  CD4 540 and VL 17,900, genotype naive (07-27-11). Changed to complera but didn't get. Was sent ATVr/TRV and has been taking. Has missed a few days. O/w feeling well.  Last CD4 400 and VL 63,054 (jan 2013).    Review of Systems  Constitutional: Negative for appetite change and unexpected weight change.  Gastrointestinal: Negative for diarrhea and constipation.  Genitourinary: Negative for dysuria.       Objective:   Physical Exam  Constitutional: He appears well-developed and well-nourished.  HENT:  Head: Normocephalic.  Mouth/Throat: No oropharyngeal exudate.  Eyes: EOM are normal. Pupils are equal, round, and reactive to light.  Neck: Neck supple.  Cardiovascular: Normal rate, regular rhythm and normal heart sounds.   Pulmonary/Chest: Effort normal and breath sounds normal.  Abdominal: Soft. Bowel sounds are normal. There is no tenderness.  Lymphadenopathy:    He has no cervical adenopathy.          Assessment & Plan:

## 2012-03-26 ENCOUNTER — Other Ambulatory Visit (INDEPENDENT_AMBULATORY_CARE_PROVIDER_SITE_OTHER): Payer: Self-pay

## 2012-03-26 DIAGNOSIS — B20 Human immunodeficiency virus [HIV] disease: Secondary | ICD-10-CM

## 2012-03-26 DIAGNOSIS — Z79899 Other long term (current) drug therapy: Secondary | ICD-10-CM

## 2012-03-27 LAB — COMPLETE METABOLIC PANEL WITH GFR
Albumin: 4.2 g/dL (ref 3.5–5.2)
Alkaline Phosphatase: 64 U/L (ref 39–117)
BUN: 9 mg/dL (ref 6–23)
CO2: 29 mEq/L (ref 19–32)
GFR, Est African American: >89 mL/min
GFR, Est Non African American: >89 mL/min
Glucose, Bld: 92 mg/dL (ref 70–99)
Potassium: 3.8 mEq/L (ref 3.5–5.3)
Total Bilirubin: 0.4 mg/dL (ref 0.3–1.2)

## 2012-03-27 LAB — CBC
HCT: 39.8 % (ref 39.0–52.0)
Hemoglobin: 13.3 g/dL (ref 13.0–17.0)
MCH: 27.2 pg (ref 26.0–34.0)
MCHC: 33.4 g/dL (ref 30.0–36.0)

## 2012-03-27 LAB — LIPID PANEL
Cholesterol: 96 mg/dL (ref 0–200)
Total CHOL/HDL Ratio: 2.3 Ratio

## 2012-03-28 LAB — T-HELPER CELL (CD4) - (RCID CLINIC ONLY): CD4 % Helper T Cell: 23 % — ABNORMAL LOW (ref 33–55)

## 2012-04-09 ENCOUNTER — Ambulatory Visit: Payer: Self-pay | Admitting: Infectious Diseases

## 2012-04-16 ENCOUNTER — Encounter: Payer: Self-pay | Admitting: Infectious Diseases

## 2012-04-16 ENCOUNTER — Ambulatory Visit (INDEPENDENT_AMBULATORY_CARE_PROVIDER_SITE_OTHER): Payer: Self-pay | Admitting: Infectious Diseases

## 2012-04-16 VITALS — BP 132/80 | HR 56 | Temp 98.1°F | Ht 69.0 in | Wt 158.0 lb

## 2012-04-16 DIAGNOSIS — B20 Human immunodeficiency virus [HIV] disease: Secondary | ICD-10-CM

## 2012-04-16 NOTE — Progress Notes (Signed)
  Subjective:    Patient ID: Donald Dean, male    DOB: 07-11-1981, 31 y.o.   MRN: [id number]  HPI 31 yo M with HIV+ dx 2001 on a routine test. He had a naive genotype 07-2010. He was previously started on ATVr/TRV but was non-adherent. He was then changed to complera which he did not take, he did take ATVr/TRV then. He was changed to complera at his last visit. Has been taking well. No side effects. Takes at bed-time with food. Had STD a couple of weeks ago, seen at [facility]. Health dept and treated for [facility]. Been feeling well.  HIV 1 RNA Quant (copies/mL)  Date Value  03/26/2012 <20   11/15/2011 63054*  07/27/2011 17900*     CD4 T Cell Abs (cmm)  Date Value  03/26/2012 380*  11/15/2011 400   07/27/2011 540       Review of Systems  Constitutional: Negative for appetite change and unexpected weight change.  Gastrointestinal: Negative for diarrhea and constipation.  Genitourinary: Negative for dysuria, hematuria and genital sores.       Objective:   Physical Exam  Constitutional: He appears well-developed and well-nourished.  HENT:  Mouth/Throat: No oropharyngeal exudate.  Eyes: EOM are normal. Pupils are equal, round, and reactive to light.  Neck: Neck supple.  Cardiovascular: Normal rate, regular rhythm and normal heart sounds.   Pulmonary/Chest: Effort normal and breath sounds normal.  Abdominal: Soft. Bowel sounds are normal. He exhibits no distension. There is no tenderness.  Lymphadenopathy:    He has no cervical adenopathy.          Assessment & Plan:

## 2012-04-16 NOTE — Assessment & Plan Note (Signed)
He is doing well, small decrease in CD4 but VL is undetectable. He is encouraged. He is given condoms (states he already has a bunch), encourage to use them. rtc 6 months with labs 2 weeks prior.

## 2012-10-08 ENCOUNTER — Other Ambulatory Visit (INDEPENDENT_AMBULATORY_CARE_PROVIDER_SITE_OTHER): Payer: Self-pay

## 2012-10-08 DIAGNOSIS — Z113 Encounter for screening for infections with a predominantly sexual mode of transmission: Secondary | ICD-10-CM

## 2012-10-08 DIAGNOSIS — B20 Human immunodeficiency virus [HIV] disease: Secondary | ICD-10-CM

## 2012-10-09 LAB — COMPREHENSIVE METABOLIC PANEL
ALT: 12 U/L (ref 0–53)
Albumin: 4.4 g/dL (ref 3.5–5.2)
BUN: 11 mg/dL (ref 6–23)
CO2: 31 mEq/L (ref 19–32)
Calcium: 9.8 mg/dL (ref 8.4–10.5)
Chloride: 105 mEq/L (ref 96–112)
Creat: 0.86 mg/dL (ref 0.50–1.35)
Potassium: 3.8 mEq/L (ref 3.5–5.3)

## 2012-10-09 LAB — CBC
HCT: 40.6 % (ref 39.0–52.0)
MCV: 81.9 fL (ref 78.0–100.0)
Platelets: 317 10*3/uL (ref 150–400)
RBC: 4.96 MIL/uL (ref 4.22–5.81)
WBC: 5 10*3/uL (ref 4.0–10.5)

## 2012-10-09 LAB — T-HELPER CELL (CD4) - (RCID CLINIC ONLY): CD4 T Cell Abs: 510 uL (ref 400–2700)

## 2012-10-10 LAB — HIV-1 RNA QUANT-NO REFLEX-BLD: HIV-1 RNA Quant, Log: 1.81 {Log} — ABNORMAL HIGH (ref <1.30)

## 2012-10-22 ENCOUNTER — Ambulatory Visit (INDEPENDENT_AMBULATORY_CARE_PROVIDER_SITE_OTHER): Payer: Self-pay | Admitting: Infectious Diseases

## 2012-10-22 ENCOUNTER — Encounter: Payer: Self-pay | Admitting: Infectious Diseases

## 2012-10-22 VITALS — BP 132/77 | HR 59 | Temp 98.4°F | Ht 69.0 in | Wt 163.0 lb

## 2012-10-22 DIAGNOSIS — B20 Human immunodeficiency virus [HIV] disease: Secondary | ICD-10-CM

## 2012-10-22 NOTE — Assessment & Plan Note (Signed)
He is doing very well. Encouraged him to take his ART while he is eating. He will try to do this. He got his flu shot at work this fall. Has condoms, does not want more. Will see him back in months.

## 2012-10-22 NOTE — Progress Notes (Signed)
  Subjective:    Patient ID: Donald Dean, male    DOB: 03-03-1981, 31 y.o.   MRN: [id number]  HPI 31 yo M with HIV+ dx 2001 on a routine test. He had a naive genotype 07-2010. He was previously started on ATVr/TRV but was non-adherent.  He was changed to [facility] January 2013.Treated at [employer] for [facility] this summer.  Has been feeling well. No problems with ART, taking after he eats.   HIV 1 RNA Quant (copies/mL)  Date Value  10/08/2012 64*  03/26/2012 <20   11/15/2011 63054*     CD4 T Cell Abs (cmm)  Date Value  10/08/2012 510   03/26/2012 380*  11/15/2011 400       Review of Systems  Constitutional: Negative for appetite change and unexpected weight change.  Gastrointestinal: Negative for diarrhea and constipation.  Genitourinary: Negative for difficulty urinating.       Objective:   Physical Exam  Constitutional: He appears well-developed and well-nourished.  HENT:  Mouth/Throat: No oropharyngeal exudate.  Eyes: EOM are normal. Pupils are equal, round, and reactive to light.  Neck: Neck supple.  Cardiovascular: Normal rate, regular rhythm and normal heart sounds.   Pulmonary/Chest: Effort normal and breath sounds normal.  Abdominal: Soft. Bowel sounds are normal. There is no tenderness.  Lymphadenopathy:    He has no cervical adenopathy.          Assessment & Plan:

## 2012-12-06 ENCOUNTER — Other Ambulatory Visit: Payer: Self-pay | Admitting: Infectious Diseases

## 2013-05-01 ENCOUNTER — Other Ambulatory Visit: Payer: Self-pay

## 2013-05-01 DIAGNOSIS — B20 Human immunodeficiency virus [HIV] disease: Secondary | ICD-10-CM

## 2013-05-01 LAB — RPR TITER: RPR Titer: 1:2 {titer}

## 2013-05-01 LAB — COMPREHENSIVE METABOLIC PANEL
AST: 18 U/L (ref 0–37)
Albumin: 4.1 g/dL (ref 3.5–5.2)
BUN: 10 mg/dL (ref 6–23)
Calcium: 9.5 mg/dL (ref 8.4–10.5)
Chloride: 105 mEq/L (ref 96–112)
Glucose, Bld: 107 mg/dL — ABNORMAL HIGH (ref 70–99)
Potassium: 3.8 mEq/L (ref 3.5–5.3)
Sodium: 141 mEq/L (ref 135–145)
Total Protein: 7.6 g/dL (ref 6.0–8.3)

## 2013-05-01 LAB — CBC
HCT: 40 % (ref 39.0–52.0)
Hemoglobin: 13.4 g/dL (ref 13.0–17.0)
WBC: 5.4 10*3/uL (ref 4.0–10.5)

## 2013-05-01 LAB — RPR: RPR Ser Ql: REACTIVE — AB

## 2013-05-01 LAB — LIPID PANEL
HDL: 49 mg/dL (ref >39)
LDL Cholesterol: 37 mg/dL (ref 0–99)
Triglycerides: 41 mg/dL (ref <150)

## 2013-05-02 LAB — HIV-1 RNA QUANT-NO REFLEX-BLD
HIV 1 RNA Quant: <20 copies/mL (ref <20)
HIV-1 RNA Quant, Log: <1.3 {Log} (ref <1.30)

## 2013-05-02 LAB — T-HELPER CELL (CD4) - (RCID CLINIC ONLY): CD4 % Helper T Cell: 28 % — ABNORMAL LOW (ref 33–55)

## 2013-05-02 LAB — T.PALLIDUM AB, TOTAL: T pallidum Antibodies (TP-PA): >8 S/CO — ABNORMAL HIGH (ref <0.90)

## 2013-05-06 ENCOUNTER — Other Ambulatory Visit: Payer: Self-pay | Admitting: *Deleted

## 2013-05-06 ENCOUNTER — Telehealth: Payer: Self-pay | Admitting: *Deleted

## 2013-05-06 DIAGNOSIS — B20 Human immunodeficiency virus [HIV] disease: Secondary | ICD-10-CM

## 2013-05-06 MED ORDER — EMTRICITAB-RILPIVIR-TENOFOV DF 200-25-300 MG PO TABS: 1.0000 | ORAL_TABLET | Freq: Every day | ORAL | Status: DC

## 2013-05-06 NOTE — Telephone Encounter (Signed)
Received prescription refill request 05/06/13.  Previously given 3 months on 12/06/12.  RN called the pharmacy to see how patient has been filling his prescription. Per Feliz Beam, PharmD, patient picked up refills in January, March, and May.  Gave the patient 30 day supply with no refills.  Will need Dr. Ninetta Lights to advise if patient needs to change regimen d/t his adherence.   Andree Coss, RN

## 2013-05-13 ENCOUNTER — Other Ambulatory Visit: Payer: Self-pay | Admitting: Licensed Clinical Social Worker

## 2013-05-13 DIAGNOSIS — B20 Human immunodeficiency virus [HIV] disease: Secondary | ICD-10-CM

## 2013-05-13 MED ORDER — EMTRICITAB-RILPIVIR-TENOFOV DF 200-25-300 MG PO TABS: 1.0000 | ORAL_TABLET | Freq: Every day | ORAL | Status: DC

## 2013-05-15 ENCOUNTER — Other Ambulatory Visit: Payer: Self-pay | Admitting: Infectious Diseases

## 2013-05-15 DIAGNOSIS — B20 Human immunodeficiency virus [HIV] disease: Secondary | ICD-10-CM

## 2013-05-15 MED ORDER — EMTRICITAB-RILPIVIR-TENOFOV DF 200-25-300 MG PO TABS: 1.0000 | ORAL_TABLET | Freq: Every day | ORAL | Status: DC

## 2013-05-15 NOTE — Telephone Encounter (Signed)
Can refill 90 days, 3 refills

## 2013-05-20 ENCOUNTER — Ambulatory Visit: Payer: Self-pay | Admitting: Infectious Diseases

## 2013-05-21 ENCOUNTER — Telehealth: Payer: Self-pay | Admitting: *Deleted

## 2013-05-21 NOTE — Telephone Encounter (Signed)
Left message on pt's phone noitfying him of his missed appointment, asking him to reschedule. Andree Coss, RN

## 2013-08-07 ENCOUNTER — Ambulatory Visit (INDEPENDENT_AMBULATORY_CARE_PROVIDER_SITE_OTHER): Payer: Self-pay | Admitting: Infectious Diseases

## 2013-08-07 ENCOUNTER — Encounter: Payer: Self-pay | Admitting: Infectious Diseases

## 2013-08-07 VITALS — BP 141/83 | HR 70 | Temp 98.6°F | Ht 69.0 in | Wt 150.0 lb

## 2013-08-07 DIAGNOSIS — A539 Syphilis, unspecified: Secondary | ICD-10-CM

## 2013-08-07 DIAGNOSIS — B20 Human immunodeficiency virus [HIV] disease: Secondary | ICD-10-CM

## 2013-08-07 DIAGNOSIS — Z23 Encounter for immunization: Secondary | ICD-10-CM

## 2013-08-07 DIAGNOSIS — Z113 Encounter for screening for infections with a predominantly sexual mode of transmission: Secondary | ICD-10-CM

## 2013-08-07 DIAGNOSIS — Z79899 Other long term (current) drug therapy: Secondary | ICD-10-CM

## 2013-08-07 NOTE — Assessment & Plan Note (Signed)
He is doing very well. Gets flu vax today. Given condoms. Will change him to tivicay/trv to make his tx simpler. Offered him triumeq but cautioned him about possible ABC related CAD. He prefers to avoid ABC. Will see him back in 6 months (he usually comes back in 1 year).

## 2013-08-07 NOTE — Progress Notes (Signed)
  Subjective:    Patient ID: Donald Dean, male    DOB: 1981/03/10, 32 y.o.   MRN: [id number]  HPI 32 yo M with HIV+ dx 2001 on a routine test. He had a naive genotype 07-2010. He was previously started on ATVr/TRV but was non-adherent. He was changed to [facility] January 2013. Has had some difficulty with being out of work, needing money.   HIV 1 RNA Quant (copies/mL)  Date Value  05/01/2013 <20   10/08/2012 64*  03/26/2012 <20      CD4 T Cell Abs (cmm)  Date Value  05/01/2013 360*  10/08/2012 510   03/26/2012 380*   No problems with ART.   Review of Systems  Constitutional: Negative for appetite change and unexpected weight change.  Gastrointestinal: Negative for diarrhea and constipation.  Genitourinary: Negative for dysuria, discharge and genital sores.       Objective:   Physical Exam  Constitutional: He appears well-developed and well-nourished.  HENT:  Mouth/Throat: No oropharyngeal exudate.  Eyes: EOM are normal. Pupils are equal, round, and reactive to light.  Neck: Neck supple.  Cardiovascular: Normal rate, regular rhythm and normal heart sounds.   Pulmonary/Chest: Effort normal and breath sounds normal.  Abdominal: Soft. Bowel sounds are normal. There is no tenderness. There is no rebound.  Lymphadenopathy:    He has no cervical adenopathy.  Skin:  Small annular rash on R anterior chest.           Assessment & Plan:

## 2013-08-07 NOTE — Addendum Note (Signed)
Addended by: Mariea Clonts D on: 08/07/2013 04:18 PM   Modules accepted: Orders

## 2013-08-07 NOTE — Assessment & Plan Note (Signed)
His rash could be consistent with syphillis or could be dermatophyte. Will recheck his RPR.

## 2013-08-08 LAB — RPR: RPR Ser Ql: REACTIVE — AB

## 2014-02-05 ENCOUNTER — Other Ambulatory Visit: Payer: Self-pay

## 2014-02-05 ENCOUNTER — Ambulatory Visit: Payer: Self-pay

## 2014-02-18 ENCOUNTER — Encounter: Payer: Self-pay | Admitting: *Deleted

## 2014-02-18 ENCOUNTER — Other Ambulatory Visit: Payer: Self-pay | Admitting: *Deleted

## 2014-02-18 DIAGNOSIS — B20 Human immunodeficiency virus [HIV] disease: Secondary | ICD-10-CM

## 2014-02-18 MED ORDER — EMTRICITAB-RILPIVIR-TENOFOV DF 200-25-300 MG PO TABS: 1.0000 | ORAL_TABLET | Freq: Every day | ORAL | Status: DC

## 2014-02-19 ENCOUNTER — Ambulatory Visit: Payer: Self-pay | Admitting: Infectious Diseases

## 2014-02-19 ENCOUNTER — Ambulatory Visit: Payer: Self-pay

## 2014-02-24 ENCOUNTER — Other Ambulatory Visit (INDEPENDENT_AMBULATORY_CARE_PROVIDER_SITE_OTHER): Payer: Self-pay

## 2014-02-24 DIAGNOSIS — B20 Human immunodeficiency virus [HIV] disease: Secondary | ICD-10-CM

## 2014-02-24 DIAGNOSIS — Z79899 Other long term (current) drug therapy: Secondary | ICD-10-CM

## 2014-02-24 DIAGNOSIS — A539 Syphilis, unspecified: Secondary | ICD-10-CM

## 2014-02-25 LAB — LIPID PANEL
CHOLESTEROL: 102 mg/dL (ref 0–200)
HDL: 54 mg/dL (ref >39)
LDL CALC: 31 mg/dL (ref 0–99)
Total CHOL/HDL Ratio: 1.9 Ratio
Triglycerides: 85 mg/dL (ref <150)
VLDL: 17 mg/dL (ref 0–40)

## 2014-02-25 LAB — COMPLETE METABOLIC PANEL WITH GFR
ALK PHOS: 65 U/L (ref 39–117)
ALT: 10 U/L (ref 0–53)
AST: 17 U/L (ref 0–37)
Albumin: 4.4 g/dL (ref 3.5–5.2)
BILIRUBIN TOTAL: 0.6 mg/dL (ref 0.2–1.2)
BUN: 11 mg/dL (ref 6–23)
CO2: 30 mEq/L (ref 19–32)
CREATININE: 1 mg/dL (ref 0.50–1.35)
Calcium: 9.5 mg/dL (ref 8.4–10.5)
Chloride: 101 mEq/L (ref 96–112)
GFR, Est African American: >89 mL/min
GLUCOSE: 76 mg/dL (ref 70–99)
Potassium: 3.9 mEq/L (ref 3.5–5.3)
Sodium: 137 mEq/L (ref 135–145)
Total Protein: 7.3 g/dL (ref 6.0–8.3)

## 2014-02-25 LAB — HIV-1 RNA QUANT-NO REFLEX-BLD: HIV 1 RNA Quant: <20 copies/mL (ref <20)

## 2014-02-25 LAB — CBC WITH DIFFERENTIAL/PLATELET
BASOS ABS: 0.1 10*3/uL (ref 0.0–0.1)
BASOS PCT: 1 % (ref 0–1)
EOS ABS: 0.3 10*3/uL (ref 0.0–0.7)
Eosinophils Relative: 5 % (ref 0–5)
HCT: 40 % (ref 39.0–52.0)
HEMOGLOBIN: 13.3 g/dL (ref 13.0–17.0)
Lymphocytes Relative: 34 % (ref 12–46)
Lymphs Abs: 1.9 10*3/uL (ref 0.7–4.0)
MCH: 27.8 pg (ref 26.0–34.0)
MCHC: 33.3 g/dL (ref 30.0–36.0)
MCV: 83.5 fL (ref 78.0–100.0)
MONO ABS: 0.3 10*3/uL (ref 0.1–1.0)
Monocytes Relative: 6 % (ref 3–12)
Neutro Abs: 3 10*3/uL (ref 1.7–7.7)
Neutrophils Relative %: 54 % (ref 43–77)
Platelets: 338 10*3/uL (ref 150–400)
RBC: 4.79 MIL/uL (ref 4.22–5.81)
RDW: 13.6 % (ref 11.5–15.5)
WBC: 5.6 10*3/uL (ref 4.0–10.5)

## 2014-02-25 LAB — RPR

## 2014-02-26 LAB — T-HELPER CELL (CD4) - (RCID CLINIC ONLY)
CD4 T CELL HELPER: 29 % — AB (ref 33–55)
CD4 T Cell Abs: 530 /uL (ref 400–2700)

## 2014-03-03 ENCOUNTER — Encounter: Payer: Self-pay | Admitting: Infectious Diseases

## 2014-03-03 ENCOUNTER — Ambulatory Visit (INDEPENDENT_AMBULATORY_CARE_PROVIDER_SITE_OTHER): Payer: Self-pay | Admitting: Infectious Diseases

## 2014-03-03 VITALS — BP 143/81 | HR 75 | Temp 98.4°F | Wt 156.0 lb

## 2014-03-03 DIAGNOSIS — Z23 Encounter for immunization: Secondary | ICD-10-CM

## 2014-03-03 DIAGNOSIS — B20 Human immunodeficiency virus [HIV] disease: Secondary | ICD-10-CM

## 2014-03-03 DIAGNOSIS — K089 Disorder of teeth and supporting structures, unspecified: Secondary | ICD-10-CM | POA: Insufficient documentation

## 2014-03-03 NOTE — Addendum Note (Signed)
Addended by: Wendall MolaOCKERHAM, Chantae Soo A on: 03/03/2014 04:58 PM   Modules accepted: Orders

## 2014-03-03 NOTE — Progress Notes (Signed)
   Subjective:    Patient ID: Donald Dean, male    DOB: 01/08/1981, 33 y.o.   MRN: [id number]  HPI 33 yo M with HIV+ dx 2001 on a routine test. He had a naive genotype 07-2010. He was previously started on ATVr/TRV but was non-adherent. [facility]  HIV 1 RNA Quant (copies/mL)  Date Value  02/24/2014 <20   05/01/2013 <20   10/08/2012 64*     CD4 T Cell Abs (/uL)  Date Value  02/24/2014 530   05/01/2013 360*  10/08/2012 510    C/o fatigue today. Has missed 3 days of ART when he had lapse of ADAP.  For last week has had post-prandial dumping.   Review of Systems  Constitutional: Negative for fever, chills, appetite change and unexpected weight change.  Gastrointestinal: Positive for diarrhea. Negative for constipation.  Genitourinary: Negative for difficulty urinating.       Objective:   Physical Exam  Constitutional: He appears well-developed and well-nourished.  HENT:  Mouth/Throat: No oropharyngeal exudate.  Eyes: EOM are normal. Pupils are equal, round, and reactive to light.  Neck: Neck supple.  Cardiovascular: Normal rate, regular rhythm and normal heart sounds.   Pulmonary/Chest: Effort normal and breath sounds normal.  Abdominal: Soft. Bowel sounds are normal. He exhibits no distension. There is no tenderness.  Lymphadenopathy:    He has no cervical adenopathy.          Assessment & Plan:

## 2014-03-03 NOTE — Assessment & Plan Note (Signed)
He is doing well. He is given condoms. Will refer him to dental. Will update his pnvx. Will check his Hep B with next lab draw.

## 2014-03-03 NOTE — Assessment & Plan Note (Signed)
Will try to get him into dental.  

## 2014-06-19 ENCOUNTER — Other Ambulatory Visit: Payer: Self-pay | Admitting: Infectious Diseases

## 2014-09-10 ENCOUNTER — Other Ambulatory Visit (INDEPENDENT_AMBULATORY_CARE_PROVIDER_SITE_OTHER): Payer: Self-pay

## 2014-09-10 DIAGNOSIS — B20 Human immunodeficiency virus [HIV] disease: Secondary | ICD-10-CM

## 2014-09-10 LAB — COMPREHENSIVE METABOLIC PANEL
ALBUMIN: 4.4 g/dL (ref 3.5–5.2)
ALT: 11 U/L (ref 0–53)
AST: 21 U/L (ref 0–37)
Alkaline Phosphatase: 65 U/L (ref 39–117)
BUN: 9 mg/dL (ref 6–23)
CALCIUM: 9.4 mg/dL (ref 8.4–10.5)
CHLORIDE: 104 meq/L (ref 96–112)
CO2: 30 meq/L (ref 19–32)
CREATININE: 0.97 mg/dL (ref 0.50–1.35)
GLUCOSE: 65 mg/dL — AB (ref 70–99)
POTASSIUM: 4.5 meq/L (ref 3.5–5.3)
Sodium: 140 mEq/L (ref 135–145)
Total Bilirubin: 0.5 mg/dL (ref 0.2–1.2)
Total Protein: 7.6 g/dL (ref 6.0–8.3)

## 2014-09-10 LAB — CBC
HEMATOCRIT: 41.5 % (ref 39.0–52.0)
Hemoglobin: 13.5 g/dL (ref 13.0–17.0)
MCH: 27.1 pg (ref 26.0–34.0)
MCHC: 32.5 g/dL (ref 30.0–36.0)
MCV: 83.2 fL (ref 78.0–100.0)
Platelets: 350 10*3/uL (ref 150–400)
RBC: 4.99 MIL/uL (ref 4.22–5.81)
RDW: 13.8 % (ref 11.5–15.5)
WBC: 5.4 10*3/uL (ref 4.0–10.5)

## 2014-09-11 LAB — T-HELPER CELL (CD4) - (RCID CLINIC ONLY)
CD4 % Helper T Cell: 30 % — ABNORMAL LOW (ref 33–55)
CD4 T CELL ABS: 530 /uL (ref 400–2700)

## 2014-09-11 LAB — HEPATITIS B SURFACE ANTIBODY,QUALITATIVE: Hep B S Ab: POSITIVE — AB

## 2014-09-12 LAB — HIV-1 RNA QUANT-NO REFLEX-BLD
HIV 1 RNA Quant: 23 copies/mL — ABNORMAL HIGH (ref <20)
HIV-1 RNA QUANT, LOG: 1.36 {Log} — AB (ref <1.30)

## 2014-09-24 ENCOUNTER — Ambulatory Visit (INDEPENDENT_AMBULATORY_CARE_PROVIDER_SITE_OTHER): Payer: Self-pay | Admitting: Infectious Diseases

## 2014-09-24 ENCOUNTER — Encounter: Payer: Self-pay | Admitting: Infectious Diseases

## 2014-09-24 VITALS — BP 134/82 | HR 70 | Temp 98.0°F | Wt 141.0 lb

## 2014-09-24 DIAGNOSIS — B20 Human immunodeficiency virus [HIV] disease: Secondary | ICD-10-CM

## 2014-09-24 DIAGNOSIS — B356 Tinea cruris: Secondary | ICD-10-CM

## 2014-09-24 DIAGNOSIS — K611 Rectal abscess: Secondary | ICD-10-CM

## 2014-09-24 DIAGNOSIS — Z79899 Other long term (current) drug therapy: Secondary | ICD-10-CM

## 2014-09-24 DIAGNOSIS — F329 Major depressive disorder, single episode, unspecified: Secondary | ICD-10-CM

## 2014-09-24 DIAGNOSIS — F32A Depression, unspecified: Secondary | ICD-10-CM | POA: Insufficient documentation

## 2014-09-24 DIAGNOSIS — Z113 Encounter for screening for infections with a predominantly sexual mode of transmission: Secondary | ICD-10-CM

## 2014-09-24 MED ORDER — TERBINAFINE HCL 1 % EX CREA: 1.0000 "application " | TOPICAL_CREAM | Freq: Two times a day (BID) | CUTANEOUS | Status: DC

## 2014-09-24 NOTE — Assessment & Plan Note (Signed)
He is doing well. Will continue his current meds (complera soul not affect mood). He is given flu shot. He is given condoms.  Will see him back in 6 months.

## 2014-09-24 NOTE — Addendum Note (Signed)
Addended by: Obe Ahlers C on: 09/24/2014 05:18 PM   Modules accepted: Level of Service

## 2014-09-24 NOTE — Assessment & Plan Note (Signed)
Will give him rx for topical terbinafine.

## 2014-09-24 NOTE — Progress Notes (Signed)
   Subjective:    Patient ID: Donald Dean, male    DOB: 07/02/1981, 33 y.o.   MRN: [id number]  HPI 33 yo M with HIV+ dx 2001 on a routine test. He had a naive genotype 07-2010. He was previously started on ATVr/TRV but was non-adherent. [facility]. Worried that he has lost wt- 15# from last visit. Has had increased life stress, doesn't have appetite- attributes to stress. Looking forward to being with family for holidays, looking forward to [employer]mas shopping. Can't think of anything he does for fun. Has mixed feelings about his work. Sleep is broken at night. Has been through recent "break-up", worried a lot about that.  Has hx of peri-rectal abscess, feels like it is still there.  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   HIV 1 RNA QUANT (copies/mL)  Date Value  09/10/2014 23*  02/24/2014 <20  05/01/2013 <20   CD4 T CELL ABS  Date Value  09/10/2014 530 /uL  02/24/2014 530 /uL  05/01/2013 360 cmm*   Review of Systems  Constitutional: Positive for appetite change and unexpected weight change.  Gastrointestinal: Negative for diarrhea and constipation.  Genitourinary: Negative for difficulty urinating.  Psychiatric/Behavioral: Positive for sleep disturbance and dysphoric mood.       Objective:   Physical Exam  Constitutional: He appears well-developed and well-nourished.  HENT:  Mouth/Throat: No oropharyngeal exudate.  Eyes: EOM are normal. Pupils are equal, round, and reactive to light.  Neck: Neck supple.  Cardiovascular: Normal rate, regular rhythm and normal heart sounds.   Pulmonary/Chest: Effort normal and breath sounds normal.  Abdominal: Soft. Bowel sounds are normal. There is no tenderness.  Lymphadenopathy:    He has no cervical adenopathy.  Skin:             Assessment & Plan:

## 2014-09-24 NOTE — Assessment & Plan Note (Signed)
Will have him seen by Bernette RedbirdKenny.

## 2014-09-24 NOTE — Assessment & Plan Note (Signed)
Will set him up for HRA due to recurrent episodes.

## 2014-09-25 ENCOUNTER — Other Ambulatory Visit: Payer: Self-pay | Admitting: *Deleted

## 2014-09-25 DIAGNOSIS — B356 Tinea cruris: Secondary | ICD-10-CM

## 2014-09-25 MED ORDER — TERBINAFINE HCL 1 % EX CREA: 1.0000 "application " | TOPICAL_CREAM | Freq: Two times a day (BID) | CUTANEOUS | Status: DC

## 2014-10-20 ENCOUNTER — Ambulatory Visit: Payer: Self-pay

## 2014-11-28 ENCOUNTER — Other Ambulatory Visit: Payer: Self-pay | Admitting: Infectious Diseases

## 2014-12-25 ENCOUNTER — Ambulatory Visit: Payer: Self-pay

## 2015-01-19 ENCOUNTER — Encounter: Payer: Self-pay | Admitting: *Deleted

## 2015-01-19 NOTE — Progress Notes (Signed)
Patient ID: Donald Dean, male   DOB: 03/08/1981, 34 y.o.   MRN: [id number] Ct [facility] since 03/21/14.  Ct expresses ongoing moderate symptoms of depression due to the ending of his previous relationship beginning 2015.  Ct reports minimal support network w/ his mother and CM Hansel Starlingdrienne as only source of support.  Ct continues to report isolation but is working w/ CM to increase opportunities to expand social support network.  Ct reports compliance w/ ART but became detectable 09/2014.  CM & ct continue to work on tx adherence and addressing barriers which are preventing it.

## 2015-04-21 ENCOUNTER — Other Ambulatory Visit: Payer: Self-pay

## 2015-05-25 ENCOUNTER — Ambulatory Visit (INDEPENDENT_AMBULATORY_CARE_PROVIDER_SITE_OTHER): Payer: Self-pay | Admitting: Infectious Diseases

## 2015-05-25 ENCOUNTER — Encounter: Payer: Self-pay | Admitting: Infectious Diseases

## 2015-05-25 VITALS — BP 138/90 | HR 60 | Temp 98.1°F | Wt 158.0 lb

## 2015-05-25 DIAGNOSIS — Z113 Encounter for screening for infections with a predominantly sexual mode of transmission: Secondary | ICD-10-CM

## 2015-05-25 DIAGNOSIS — F32A Depression, unspecified: Secondary | ICD-10-CM

## 2015-05-25 DIAGNOSIS — Z79899 Other long term (current) drug therapy: Secondary | ICD-10-CM

## 2015-05-25 DIAGNOSIS — B20 Human immunodeficiency virus [HIV] disease: Secondary | ICD-10-CM

## 2015-05-25 DIAGNOSIS — F329 Major depressive disorder, single episode, unspecified: Secondary | ICD-10-CM

## 2015-05-25 DIAGNOSIS — K611 Rectal abscess: Secondary | ICD-10-CM

## 2015-05-25 LAB — CBC
HCT: 41.4 % (ref 39.0–52.0)
Hemoglobin: 13.2 g/dL (ref 13.0–17.0)
MCH: 26.8 pg (ref 26.0–34.0)
MCHC: 31.9 g/dL (ref 30.0–36.0)
MCV: 84.1 fL (ref 78.0–100.0)
MPV: 9.3 fL (ref 8.6–12.4)
PLATELETS: 339 10*3/uL (ref 150–400)
RBC: 4.92 MIL/uL (ref 4.22–5.81)
RDW: 13.8 % (ref 11.5–15.5)
WBC: 5 10*3/uL (ref 4.0–10.5)

## 2015-05-25 LAB — COMPREHENSIVE METABOLIC PANEL
ALT: 15 U/L (ref 0–53)
AST: 20 U/L (ref 0–37)
Albumin: 4.3 g/dL (ref 3.5–5.2)
Alkaline Phosphatase: 63 U/L (ref 39–117)
BUN: 11 mg/dL (ref 6–23)
CO2: 30 mEq/L (ref 19–32)
Calcium: 9.5 mg/dL (ref 8.4–10.5)
Chloride: 104 mEq/L (ref 96–112)
Creat: 0.81 mg/dL (ref 0.50–1.35)
GLUCOSE: 78 mg/dL (ref 70–99)
Potassium: 4.5 mEq/L (ref 3.5–5.3)
SODIUM: 142 meq/L (ref 135–145)
TOTAL PROTEIN: 7.7 g/dL (ref 6.0–8.3)
Total Bilirubin: 0.4 mg/dL (ref 0.2–1.2)

## 2015-05-25 LAB — LIPID PANEL
Cholesterol: 105 mg/dL (ref 0–200)
HDL: 48 mg/dL (ref >=40)
LDL Cholesterol: 38 mg/dL (ref 0–99)
TRIGLYCERIDES: 94 mg/dL (ref <150)
Total CHOL/HDL Ratio: 2.2 Ratio
VLDL: 19 mg/dL (ref 0–40)

## 2015-05-25 NOTE — Assessment & Plan Note (Signed)
Will try to get him into anoscopy

## 2015-05-25 NOTE — Assessment & Plan Note (Signed)
Doing well. Will send him to lab today.  Offered/refused condoms.  rtc 6 months

## 2015-05-25 NOTE — Assessment & Plan Note (Signed)
Seems to be doing better, appreciate [facility] f/u.

## 2015-05-25 NOTE — Progress Notes (Signed)
   Subjective:    Patient ID: Donald Guessonald A Dean, male    DOB: 10/22/1981, 34 y.o.   MRN: [id number]  HPI 34 yo M with HIV+ dx 2001 on a routine test. He had a naive genotype 07-2010. He was previously started on ATVr/TRV but was non-adherent. He was [facility]. Was seen for depression at last visit- has been [facility]rien[facility]. Feels like his mood is better.   HIV 1 RNA QUANT (copies/mL)  Date Value  09/10/2014 23*  02/24/2014 <20  05/01/2013 <20   CD4 T CELL ABS  Date Value  09/10/2014 530 /uL  02/24/2014 530 /uL  05/01/2013 360 cmm*    Review of Systems  Constitutional: Negative for appetite change and unexpected weight change.  Gastrointestinal: Negative for diarrhea and constipation.  Genitourinary: Negative for difficulty urinating.  Psychiatric/Behavioral: Negative for sleep disturbance.       Objective:   Physical Exam  Constitutional: He appears well-developed and well-nourished.  HENT:  Mouth/Throat: No oropharyngeal exudate.  Eyes: Pupils are equal, round, and reactive to light.  Neck: Neck supple.  Cardiovascular: Normal rate, regular rhythm and normal heart sounds.   Pulmonary/Chest: Effort normal and breath sounds normal.  Abdominal: Soft. Bowel sounds are normal. There is no tenderness. There is no rebound.  Lymphadenopathy:    He has no cervical adenopathy.  Psychiatric: He has a normal mood and affect.          Assessment & Plan:

## 2015-05-26 LAB — HIV-1 RNA QUANT-NO REFLEX-BLD: HIV-1 RNA Quant, Log: <1.3 {Log} (ref <1.30)

## 2015-05-26 LAB — T-HELPER CELL (CD4) - (RCID CLINIC ONLY)
CD4 % Helper T Cell: 31 % — ABNORMAL LOW (ref 33–55)
CD4 T Cell Abs: 580 /uL (ref 400–2700)

## 2015-05-26 LAB — RPR

## 2015-05-26 LAB — URINE CYTOLOGY ANCILLARY ONLY
Chlamydia: NEGATIVE
Neisseria Gonorrhea: NEGATIVE

## 2015-06-01 ENCOUNTER — Encounter (HOSPITAL_COMMUNITY): Payer: Self-pay | Admitting: Emergency Medicine

## 2015-06-01 ENCOUNTER — Emergency Department (HOSPITAL_COMMUNITY)
Admission: EM | Admit: 2015-06-01 | Discharge: 2015-06-01 | Disposition: A | Discharge status: Home / Self Care | Payer: Self-pay | Attending: Emergency Medicine | Admitting: Emergency Medicine

## 2015-06-01 DIAGNOSIS — Z21 Asymptomatic human immunodeficiency virus [HIV] infection status: Secondary | ICD-10-CM | POA: Insufficient documentation

## 2015-06-01 DIAGNOSIS — Z79899 Other long term (current) drug therapy: Secondary | ICD-10-CM | POA: Insufficient documentation

## 2015-06-01 DIAGNOSIS — M545 Low back pain, unspecified: Secondary | ICD-10-CM

## 2015-06-01 DIAGNOSIS — Z720 Tobacco use: Secondary | ICD-10-CM | POA: Insufficient documentation

## 2015-06-01 MED ORDER — NAPROXEN 500 MG PO TABS: 500.0000 mg | ORAL_TABLET | Freq: Two times a day (BID) | ORAL | Status: DC

## 2015-06-01 MED ORDER — METHOCARBAMOL 500 MG PO TABS: 500.0000 mg | ORAL_TABLET | Freq: Two times a day (BID) | ORAL | Status: DC

## 2015-06-01 MED ORDER — OXYCODONE-ACETAMINOPHEN 5-325 MG PO TABS: 2.0000 | ORAL_TABLET | ORAL | Status: DC | PRN

## 2015-06-01 NOTE — Discharge Instructions (Signed)
Back Exercises Return for any bowel or bladder incontinence. Follow up with a primary care provider using the resource guide below. Take naproxen for pain and percocet for breakthrough pain. Back exercises help treat and prevent back injuries. The goal of back exercises is to increase the strength of your abdominal and back muscles and the flexibility of your back. These exercises should be started when you no longer have back pain. Back exercises include:  Pelvic Tilt. Lie on your back with your knees bent. Tilt your pelvis until the lower part of your back is against the floor. Hold this position 5 to 10 sec and repeat 5 to 10 times.  Knee to Chest. Pull first 1 knee up against your chest and hold for 20 to 30 seconds, repeat this with the other knee, and then both knees. This may be done with the other leg straight or bent, whichever feels better.  Sit-Ups or Curl-Ups. Bend your knees 90 degrees. Start with tilting your pelvis, and do a partial, slow sit-up, lifting your trunk only 30 to 45 degrees off the floor. Take at least 2 to 3 seconds for each sit-up. Do not do sit-ups with your knees out straight. If partial sit-ups are difficult, simply do the above but with only tightening your abdominal muscles and holding it as directed.  Hip-Lift. Lie on your back with your knees flexed 90 degrees. Push down with your feet and shoulders as you raise your hips a couple inches off the floor; hold for 10 seconds, repeat 5 to 10 times.  Back arches. Lie on your stomach, propping yourself up on bent elbows. Slowly press on your hands, causing an arch in your low back. Repeat 3 to 5 times. Any initial stiffness and discomfort should lessen with repetition over time.  Shoulder-Lifts. Lie face down with arms beside your body. Keep hips and torso pressed to floor as you slowly lift your head and shoulders off the floor. Do not overdo your exercises, especially in the beginning. Exercises may cause you some mild  back discomfort which lasts for a few minutes; however, if the pain is more severe, or lasts for more than 15 minutes, do not continue exercises until you see your caregiver. Improvement with exercise therapy for back problems is slow.  See your caregivers for assistance with developing a proper back exercise program. Document Released: 12/01/2004 Document Revised: 01/16/2012 Document Reviewed: 08/25/2011 [facility] Patient Information 2015 [place], [place]. This information is not intended to replace advice given to you by your health care provider. Make sure you discuss any questions you have with your health care provider.  Emergency Department Resource Guide 1) Find a Doctor and Pay Out of Pocket Although you won't have to find out who is covered by your insurance plan, it is a good idea to ask around and get recommendations. You will then need to call the office and see if the doctor you have chosen will accept you as a new patient and what types of options they offer for patients who are self-pay. Some doctors offer discounts or will set up payment plans for their patients who do not have insurance, but you will need to ask so you aren't surprised when you get to your appointment.  2) Contact Your Local Health Department Not all health departments have doctors that can see patients for sick visits, but many do, so it is worth a call to see if yours does. If you don't know where your local health department is,  you can check in your phone book. The CDC also has a tool to help you locate your state's health department, and many state websites also have listings of all of their local health departments.  3) Find a Walk-in Clinic If your illness is not likely to be very severe or complicated, you may want to try a walk in clinic. These are popping up all over the country in pharmacies, drugstores, and shopping centers. They're usually staffed by nurse practitioners or physician assistants that have been  trained to treat common illnesses and complaints. They're usually fairly quick and inexpensive. However, if you have serious medical issues or chronic medical problems, these are probably not your best option.  No Primary Care Doctor: - Call Health Connect at  [phone] - they can help you locate a primary care doctor that  accepts your insurance, provides certain services, etc. - Physician Referral Service- [phone]  Chronic Pain Problems: Organization         Address  Phone   Notes  [facility]  [phone]) [phone] Patients need to be referred by their primary care doctor.   Medication Assistance: Organization         Address  Phone   Notes  [facility] [address] [place], [place] [address] [phone] --Must be a resident of [facility] -- Must have NO insurance coverage whatsoever (no Medicaid/ Medicare, etc.) -- The pt. MUST have a primary care doctor that directs their care regularly and follows them in the community   MedAssist  [phone]   [employer]  [phone]) [phone]    Agencies that provide inexpensive medical care: Organization         Address  Phone   Notes  [facility]  [phone]   [facility]    [phone]) [phone]   [facility] [address] [place], [place] [address] [phone]) [phone]   [facility] [address], [place] [phone]   Planned Parenthood    [phone]   [facility]    [phone](336) [phone]   [facility]  [address], [place] Phone:  [phone], Fax:  [phone] Hours of Operation:  9 am - 6 pm, M-F.  Also accepts Medicaid/Medicare and self-pay.  [facility]  [address], [place] Phone: [phone]) [phone], Fax: [phone]. Hours of Operation:  8:30 am - 5:30 pm, M-F.  Also accepts Medicaid and self-pay.   [facility] [place] [address], [place] Phone: [phone]) [phone]   [facility] [address], [place], [place] [phone], Ext. [phone] Mondays & Thursdays: 7-9 AM.  First 15 patients are seen on a first come, first serve basis.    Medicaid-accepting [facility] Providers:  Organization         Address  Phone   Notes  [facility] [address], [place] [phone] Also accepts self-pay patients.  [facility] [address] [place], [place]  [phone]   [facility] [address], [place] [phone]   [facility] [address] [place] [address], [place] [phone]   Renaye RakersVeita Bland [address], [place]   [phone]) [phone] Only accepts [place] Access [place] patients after they have their name  applied to their card.   Self-Pay (no insurance) in [facility]:  Organization         Address  Phone   Notes  Sickle Cell Patients, [facility] [place] [phone]   [facility] [place] [phone]   [facility] [place] [place]  [place], [place], [place] [phone]   [facility]/Dr. Osei-Bonsu  [address], [place] or [address], [place] [phone] Phone number for both [place] and [place] locations is the same.  [facility] [address] [place] [phone]   [facility] [address] [place] or [address] [phone]   [facility] [address] [place] [phone], phone; [phone], fax Sees patients 1st and 3rd Saturday of every month.  Must not qualify for public or private insurance (i.e. Medicaid, Medicare, [place] Health Choice, Veterans' Benefits)  Household income should be no  more than 200% of the poverty level The clinic cannot treat you if you are pregnant or think you are pregnant  Sexually transmitted diseases are not treated at the clinic.    Dental Care: Organization         Address  Phone  Notes  [facility] of [facility] [place] [phone] Accepts children up to age 31 who are enrolled in [place] or [place]; pregnant women with a Medicaid card; and children who have applied for Medicaid or [place] Health Choice, but were declined, whose parents can pay a reduced fee at time of service.  [facility] of [facility]  [address], [place] [phone] Accepts children up to age 27 who are enrolled in [place] or [place]; pregnant women with a Medicaid card; and children who have applied for Medicaid or [place] Health Choice, but were declined, whose parents can pay a reduced fee at time of service.  [facility] Adult [facility] Access PROGRAM  [place] [phone] Patients are seen by appointment only. Walk-ins are not accepted. [place] will see patients 16 years of age and older. Monday - Tuesday (8am-5pm) Most Wednesdays (8:30-5pm) $30 per visit, cash only  [facility] Adult Dental Access PROGRAM  [address], [facility] [phone] Patients are seen by appointment only. Walk-ins are not accepted. [place] will see patients 45 years of age and older. One Wednesday Evening (Monthly: Volunteer Based).  $30 per visit, cash only  [place]  [phone] for adults; Children under age 73, call Graduate Pediatric Dentistry at [phone]. Children aged 27-14, please call [phone] to request a pediatric application.  Dental services are provided in all areas of dental care including fillings, crowns and bridges, complete and partial dentures, implants, gum treatment, root canals,  and extractions. Preventive care is also provided. Treatment is provided to both adults and children. Patients are selected via a lottery and there is often a waiting list.   [facility] [address] [place]  [phone] [email or web address]   [facility] [address] [place], [place] [phone], Ext. [phone] Second and Fourth Thursday of each month, opens at 6:30 AM; Clinic ends at 9 AM.  Patients are seen on a first-come  first-served basis, and a limited number are seen during each clinic.   [facility]  [address] Hillard Danker [place], [place] [phone]   Eligibility Requirements You must have lived in [place], [place], or [place] counties for at least the last three months.   You cannot be eligible for state or federal sponsored [employer], including [employer], [place], or [employer].   You generally cannot be eligible for healthcare insurance through your employer.    How to apply: Eligibility screenings are held every Tuesday and Wednesday afternoon from 1:00 pm until 4:00 pm. You do not need an appointment for the interview!  [facility] [address], [place], [place]   [place]  [place] Department  [place]  [phone]    Behavioral Health Resources in the Community: Intensive Outpatient Programs Organization         Address  Phone  Notes  [place]. [address] [place], [place] [phone]   [facility] [address] [place], [place]   ADS: Alcohol & Drug Svcs [address] [place], [place]   [place] [address],  [place], [place] or [phone]   Substance Abuse Resources Organization         Address  Phone  Notes  Alcohol and Drug Services  [phone]    [place]  [phone]   [facility] [place]   Chinita Pester  [phone]   Residential & Outpatient Substance Abuse Program  [phone]   Psychological Services Organization         Address  Phone  Notes  [facility] Jenner  [place]  [phone]   [place] [address] [place] or [phone]    Mobile Crisis Teams Organization         Address  Phone  Notes  [facility], Mobile Crisis Care Unit  [phone]   [facility]  [address] [place], [place]   [place] [address] [place] [phone]    Self-Help/Support Groups Organization         Address  Phone             Notes  [place]. of [place] - variety of support groups  [place] Call for more information  Narcotics Anonymous (NA), [facility] [address], [employer] [place]  2 meetings at this location   Special educational needs teacher         Address  Phone  Notes  ASAP Residential Treatment [place],    [place]  [phone]   [facility]  [address], [place] [address], [place], [place]   [place], [place] [phone] Admissions: 8am-3pm M-F  Incentives Substance [place] [address]    [place], [place] [phone]   [facility] [address] Jadene Pierini [place], [place]   [facility] [address],  [place], [place]   [facility] Programs - Intensive Outpatient [address], [place], [place]   [facility] ([place].,  [place], [place] or [phone]   [facility]idential Treatment Services (RTS) [address], [place], [place] Accepts Medicaid  Fellowship [address],    [place] [phone] Substance Abuse/Addiction Treatment   [facility] Organization         Address  Phone  Notes  [facility]  [phone]   Domenic Schwab, PhD [address] [address] [place], [place]   [phone] or [phone]   Pastura Garrison Hayden [place], [place] [phone]   [address], [place], [place] [phone] Insurance/Medicaid/sponsorship through [facility] and Families [address], [address]                                    [place], [place] [phone] [place] [address], [place] [phone]    Dr. Adele Schilder  [phone]   [facility] [place] Dept. 1) [address] [place] 2) [place] 3)  [address], [place] [phone]  [phone]   [place] [phone] or [phone] (After Hours)

## 2015-06-01 NOTE — ED Provider Notes (Signed)
CSN: [id number]     Arrival date & time 06/01/15  1748 History  This chart was scribed for Catha Gosselin, PA-C working with Gerhard Munch, MD by Evon Slack, ED Scribe. This patient was seen in room TR06C/TR06C and the patient's care was started at 6:16 PM.     Chief Complaint  Patient presents with  . Back Pain    The patient said he has been having back pain off and on for 5years.  He denies any injury but says he was sitting on the side and layed back and thinks that's where he maybe twisted his back.   The history is provided by the patient. No language interpreter was used.   HPI Comments: Donald Dean is a 34 y.o. male who presents to the Emergency Department complaining of recurrent low back pain onset 3 days prior. Pt states that he just woke up with back pain. Pt denies any medications PTA. Pt denies recent drug or steroid use. Pt denies numbness, bowel/bladder incontinence or weakness. He denies any numbness or tingling in the legs. Pt denies injury or fall.    Past Medical History  Diagnosis Date  . HIV infection   . Chest pain   . SOB (shortness of breath)    History reviewed. No pertinent past surgical history. History reviewed. No pertinent family history. History  Substance Use Topics  . Smoking status: Current Some Day Smoker -- 0.10 packs/day  . Smokeless tobacco: Never Used     Comment: trying to quit  . Alcohol Use: 1.2 oz/week    2 Standard drinks or equivalent per week     Comment: occasional    Review of Systems  Genitourinary: Negative.   Musculoskeletal: Positive for back pain.  Neurological: Negative for weakness and numbness.     Allergies  Review of patient's allergies indicates no known allergies.  Home Medications   Prior to Admission medications   Medication Sig Start Date End Date Taking? Authorizing Provider  Emtricitab-Rilpivir-Tenofovir (COMPLERA) 200-25-300 MG TABS Take 1 tablet by mouth daily with supper. 02/18/14   Gardiner Barefoot, MD  methocarbamol (ROBAXIN) 500 MG tablet Take 1 tablet (500 mg total) by mouth 2 (two) times daily. 06/01/15   Robinn Overholt Patel-Mills, PA-C  naproxen (NAPROSYN) 500 MG tablet Take 1 tablet (500 mg total) by mouth 2 (two) times daily. 06/01/15   Elvira Langston Patel-Mills, PA-C  oxyCODONE-acetaminophen (PERCOCET/ROXICET) 5-325 MG per tablet Take 2 tablets by mouth every 4 (four) hours as needed for severe pain. 06/01/15   Fionna Merriott Patel-Mills, PA-C  terbinafine (LAMISIL) 1 % cream Apply 1 application topically 2 (two) times daily. 09/25/14   Ginnie Smart, MD   BP 134/76 mmHg  Pulse 65  Temp(Src) 99.3 F (37.4 C) (Oral)  Resp 18  SpO2 99%   Physical Exam  Constitutional: He is oriented to person, place, and time. He appears well-developed and well-nourished. No distress.  HENT:  Head: Normocephalic and atraumatic.  Eyes: Conjunctivae and EOM are normal.  Neck: Neck supple. No tracheal deviation present.  Cardiovascular: Normal rate.   Pulmonary/Chest: Effort normal. No respiratory distress.  Musculoskeletal: Normal range of motion. He exhibits tenderness.  5/5 strength in lower extremities bilaterally, negative SLR, able to dorsi and plantar flex without difficulty, tenderness along the right lumbar paravertebral musculature, no midline tenderness, no saddle anesthesia, NVI,  Ambulatory with steady gait.   Neurological: He is alert and oriented to person, place, and time.  Skin: Skin is warm and dry.  Psychiatric: He has a normal mood and affect. His behavior is normal.  Nursing note and vitals reviewed.   ED Course  Procedures (including critical care time) DIAGNOSTIC STUDIES: Oxygen Saturation is 99% on RA, normal by my interpretation.    COORDINATION OF CARE: 6:28 PM-Discussed treatment plan with pt at bedside and pt agreed to plan.     Labs Review Labs Reviewed - No data to display  Imaging Review No results found.   EKG Interpretation None      MDM   Final diagnoses:   Right-sided low back pain without sciatica  Patient presents for back pain. He has no signs of cauda equina syndrome. He denies any injury to the back. I believe this is musculoskeletal related. He states he has had this in the past and it feels the same. I gave him Percocet, naproxen, Robaxin. I also gave the patient return precautions. I gave him the resource guide to find a provider. Patient verbally agrees with the plan.  I personally performed the services described in this documentation, which was scribed in my presence. The recorded information has been reviewed and is accurate.      Catha Gosselin, PA-C 06/01/15 1921  Gerhard Munch, MD 06/01/15 2200

## 2015-06-01 NOTE — ED Notes (Signed)
The patient said he has been having back pain off and on for 5years.  He denies any injury but says he was sitting on the side and layed back and thinks that's where he maybe twisted his back.  He was rates his 7/10.

## 2015-09-10 ENCOUNTER — Other Ambulatory Visit: Payer: Self-pay | Admitting: Infectious Diseases

## 2015-11-24 ENCOUNTER — Emergency Department (HOSPITAL_COMMUNITY): Payer: Self-pay

## 2015-11-24 ENCOUNTER — Emergency Department (HOSPITAL_COMMUNITY)
Admission: EM | Admit: 2015-11-24 | Discharge: 2015-11-24 | Disposition: A | Discharge status: Home / Self Care | Payer: Self-pay | Attending: Emergency Medicine | Admitting: Emergency Medicine

## 2015-11-24 ENCOUNTER — Encounter (HOSPITAL_COMMUNITY): Payer: Self-pay | Admitting: Emergency Medicine

## 2015-11-24 DIAGNOSIS — B20 Human immunodeficiency virus [HIV] disease: Secondary | ICD-10-CM | POA: Insufficient documentation

## 2015-11-24 DIAGNOSIS — M67432 Ganglion, left wrist: Secondary | ICD-10-CM | POA: Insufficient documentation

## 2015-11-24 DIAGNOSIS — M25532 Pain in left wrist: Secondary | ICD-10-CM

## 2015-11-24 DIAGNOSIS — F17200 Nicotine dependence, unspecified, uncomplicated: Secondary | ICD-10-CM | POA: Insufficient documentation

## 2015-11-24 DIAGNOSIS — Z79899 Other long term (current) drug therapy: Secondary | ICD-10-CM | POA: Insufficient documentation

## 2015-11-24 MED ORDER — NAPROXEN 250 MG PO TABS: 250.0000 mg | ORAL_TABLET | Freq: Two times a day (BID) | ORAL | Status: DC

## 2015-11-24 NOTE — ED Provider Notes (Signed)
CSN: [id number]     Arrival date & time 11/24/15  1535 History  By signing my name below, I, Freida Busman, attest that this documentation has been prepared under the direction and in the presence of non-physician practitioner, Everlene Farrier, PA-C. Electronically Signed: Freida Busman, Scribe. 11/24/2015. 4:07 PM.    No chief complaint on file.   The history is provided by the patient. No language interpreter was used.     HPI Comments:  Donald Dean is a 35 y.o. male who presents to the Emergency Department complaining of mild swelling to his left wrist x a few days with associated 8/10 pain to the site. No known injury. He denies weakness or numbness of the extremity. Pt also reports tingling to first, middle, and ring fingers of the left hand. He has taken aleve with minimal relief; last dose was this AM. Pt has no other complaints or symptoms at this time. No elbow or forearm pain.     Past Medical History  Diagnosis Date  . HIV infection   . Chest pain   . SOB (shortness of breath)    No past surgical history on file. No family history on file. Social History  Substance Use Topics  . Smoking status: Current Some Day Smoker -- 0.10 packs/day  . Smokeless tobacco: Never Used     Comment: trying to quit  . Alcohol Use: 1.2 oz/week    2 Standard drinks or equivalent per week     Comment: occasional    Review of Systems  Constitutional: Negative for fever and chills.  Respiratory: Negative for shortness of breath.   Cardiovascular: Negative for chest pain.  Musculoskeletal: Positive for myalgias, joint swelling and arthralgias.       Left wrist  Skin: Negative for rash and wound.  Neurological: Negative for weakness and numbness.    Allergies  Review of patient's allergies indicates no known allergies.  Home Medications   Prior to Admission medications   Medication Sig Start Date End Date Taking? Authorizing Provider  Emtricitab-Rilpivir-Tenofovir (COMPLERA)  200-25-300 MG TABS Take 1 tablet by mouth daily with supper. 02/18/14   Gardiner Barefoot, MD  methocarbamol (ROBAXIN) 500 MG tablet Take 1 tablet (500 mg total) by mouth 2 (two) times daily. 06/01/15   Hanna Patel-Mills, PA-C  naproxen (NAPROSYN) 250 MG tablet Take 1 tablet (250 mg total) by mouth 2 (two) times daily with a meal. 11/24/15   Everlene Farrier, PA-C  oxyCODONE-acetaminophen (PERCOCET/ROXICET) 5-325 MG per tablet Take 2 tablets by mouth every 4 (four) hours as needed for severe pain. 06/01/15   Hanna Patel-Mills, PA-C  terbinafine (LAMISIL) 1 % cream Apply 1 application topically 2 (two) times daily. 09/25/14   Ginnie Smart, MD   BP 132/81 mmHg  Pulse 68  Temp(Src) 98.3 F (36.8 C) (Oral)  Resp 18  Wt 68.607 kg  SpO2 100% Physical Exam  Constitutional: He appears well-developed and well-nourished. No distress.  Nontoxic appearing.  HENT:  Head: Normocephalic and atraumatic.  Eyes: Right eye exhibits no discharge. Left eye exhibits no discharge.  Cardiovascular: Normal rate, regular rhythm and intact distal pulses.   Bilateral radial pulses are intact. Good capillary refill to his left distal fingertips.  Pulmonary/Chest: Effort normal. No respiratory distress.  Musculoskeletal: Normal range of motion. He exhibits tenderness. He exhibits no edema.  Tenderness noted to left lateral wrist. Good left wrist ROM. No deformity. No snuff box tenderness. No left hand erythema or edema.  No left elbow  or forearm tenderness.  Sensation intact to left distal extremity   Neurological: He is alert. Coordination normal.  Sensation intact to his bilateral distal fingertips.  Skin: Skin is warm and dry. No rash noted. He is not diaphoretic. No erythema. No pallor.  Psychiatric: He has a normal mood and affect. His behavior is normal.  Nursing note and vitals reviewed.   ED Course  Procedures   DIAGNOSTIC STUDIES:  Oxygen Saturation is 100% on RA, normal by my interpretation.     COORDINATION OF CARE:  3:51 PM Discussed treatment plan with pt at bedside and pt agreed to plan.  Imaging Review No results found.    MDM   Final diagnoses:  Left wrist pain   This is a 35 y.o. male who presents to the Emergency Department complaining of mild swelling to his left wrist x a few days with associated 8/10 pain to the site. No known injury. He denies weakness of the extremity. Pt also reports tingling to first, middle, and ring fingers of the left hand.  On exam patient is afebrile nontoxic appearing. He has mild tenderness to the lateral aspect of his left wrist. Good wrist range of motion. He is neurovascularly intact. Will obtain left wrist x-ray.  At shift change patient is awaiting x-ray. Patient care handed off to [facility], PA-C will disposition the patient accordingly.  I personally performed the services described in this documentation, which was scribed in my presence. The recorded information has been reviewed and is accurate.       Everlene Farrier, PA-C 11/24/15 1610  Pricilla Loveless, MD 11/25/15 [phone]

## 2015-11-24 NOTE — ED Notes (Signed)
Pt c/o pain to left wrist x's 4 days.  Pt denies any injury

## 2015-11-24 NOTE — ED Provider Notes (Signed)
Care assumed at shift change from [facility]. Pt here with L wrist pain and swelling onset a few days ago, no known injury. Plan is to f/up with xray then dispo accordingly.  Physical Exam  BP 132/81 mmHg  Pulse 68  Temp(Src) 98.3 F (36.8 C) (Oral)  Resp 18  Wt 68.607 kg  SpO2 100%  Physical Exam MsK: L wrist with FROM intact, mild TTP over a ?ganglion cyst on the dorsal aspect of the L wrist/hand, mobile and hard, no surrounding erythema or warmth, no joint swelling, strength and sensation grossly intact, distal pulses intact.   ED Course  Procedures Results for orders placed or performed in visit on 05/25/15  CBC  Result Value Ref Range   WBC 5.0 4.0 - 10.5 K/uL   RBC 4.92 4.22 - 5.81 MIL/uL   Hemoglobin 13.2 13.0 - 17.0 g/dL   HCT 78.2 95.6 - 21.3 %   MCV 84.1 78.0 - 100.0 fL   MCH 26.8 26.0 - 34.0 pg   MCHC 31.9 30.0 - 36.0 g/dL   RDW 08.6 57.8 - 46.9 %   Platelets 339 150 - 400 K/uL   MPV 9.3 8.6 - 12.4 fL  Comprehensive metabolic panel  Result Value Ref Range   Sodium 142 135 - 145 mEq/L   Potassium 4.5 3.5 - 5.3 mEq/L   Chloride 104 96 - 112 mEq/L   CO2 30 19 - 32 mEq/L   Glucose, Bld 78 70 - 99 mg/dL   BUN 11 6 - 23 mg/dL   Creat 6.29 5.28 - 4.13 mg/dL   Total Bilirubin 0.4 0.2 - 1.2 mg/dL   Alkaline Phosphatase 63 39 - 117 U/L   AST 20 0 - 37 U/L   ALT 15 0 - 53 U/L   Total Protein 7.7 6.0 - 8.3 g/dL   Albumin 4.3 3.5 - 5.2 g/dL   Calcium 9.5 8.4 - 24.4 mg/dL  T-helper cell (CD4)- ([facility] clinic only)  Result Value Ref Range   CD4 T Cell Abs 580 400 - 2700 /uL   CD4 % Helper T Cell 31 (L) 33 - 55 %  RPR  Result Value Ref Range   RPR Ser Ql NON REAC NON REAC  Lipid panel  Result Value Ref Range   Cholesterol 105 0 - 200 mg/dL   Triglycerides 94 <010 mg/dL   HDL 48 >=27 mg/dL   Total CHOL/HDL Ratio 2.2 Ratio   VLDL 19 0 - 40 mg/dL   LDL Cholesterol 38 0 - 99 mg/dL  HIV 1 RNA quant-no reflex-bld  Result Value Ref Range   HIV 1 RNA Quant <20 <20  copies/mL   HIV1 RNA Quant, Log <1.30 <1.30 log 10  Urine cytology ancillary only  Result Value Ref Range   Chlamydia Negative    Neisseria gonorrhea Negative    Dg Wrist Complete Left  11/24/2015  CLINICAL DATA:  Radial side left wrist pain and swelling for 3 days. No known injury. Initial encounter. EXAM: LEFT WRIST - COMPLETE 3+ VIEW COMPARISON:  None. FINDINGS: There is no evidence of fracture or dislocation. There is no evidence of arthropathy or other focal bone abnormality. Soft tissues are unremarkable. IMPRESSION: Normal exam. Electronically Signed   By: Drusilla Kanner M.D.   On: 11/24/2015 16:19     Meds ordered this encounter  Medications  . naproxen (NAPROSYN) 250 MG tablet    Sig: Take 1 tablet (250 mg total) by mouth 2 (two) times daily with a  meal.    Dispense:  20 tablet    Refill:  0    Order Specific Question:  Supervising Provider    Answer:  Eber Hong [[id number]]     MDM:   ICD-9-CM ICD-10-CM   1. Left wrist pain 719.43 M25.532   2. Ganglion cyst of wrist, left 727.41 M67.432    5:19 PM  Xray neg. Likely ganglion cyst. Ace bandaged applied. Discussed RICE and use of naprosyn/tylenol. F/up with hand specialist in 1wk for ongoing management, and establish care with [facility]. I explained the diagnosis and have given explicit precautions to return to the ER including for any other new or worsening symptoms. The patient understands and accepts the medical plan as it's been dictated and I have answered their questions. Discharge instructions concerning home care and prescriptions have been given. The patient is STABLE and is discharged to home in good condition.   Cozette Braggs Camprubi-Soms, PA-C 11/24/15 1720  Pricilla Loveless, MD 11/25/15 [phone]

## 2015-11-24 NOTE — Discharge Instructions (Signed)
Apply ice to the area of soreness, 20 minutes every hour. Use tylenol and naprosyn as directed as needed for pain. Use ace bandage as needed for pain. Follow up with the hand specialist listed above in 1 week for ongoing management of your wrist pain. Follow up with [facility] to establish medical care. Return to the ER for changes or worsening symptoms.   Ganglion Cyst A ganglion cyst is a noncancerous, fluid-filled lump that occurs near joints or tendons. The ganglion cyst grows out of a joint or the lining of a tendon. It most often develops in the hand or wrist, but it can also develop in the shoulder, elbow, hip, knee, ankle, or foot. The round or oval ganglion cyst can be the size of a pea or larger than a grape. Increased activity may enlarge the size of the cyst because more fluid starts to build up.  CAUSES It is not known what causes a ganglion cyst to grow. However, it may be related to:  Inflammation or irritation around the joint.  An injury.  Repetitive movements or overuse.  Arthritis. RISK FACTORS Risk factors include:  Being a woman.  Being age 50-50. SIGNS AND SYMPTOMS Symptoms may include:   A lump. This most often appears on the hand or wrist, but it can occur in other areas of the body.  Tingling.  Pain.  Numbness.  Muscle weakness.  Weak grip.  Less movement in a joint. DIAGNOSIS Ganglion cysts are most often diagnosed based on a physical exam. Your health care provider will feel the lump and may shine a light alongside it. If it is a ganglion cyst, a light often shines through it. Your health care provider may order an X-ray, ultrasound, or MRI to rule out other conditions. TREATMENT Ganglion cysts usually go away on their own without treatment. If pain or other symptoms are involved, treatment may be needed. Treatment is also needed if the ganglion cyst limits your movement or if it gets infected. Treatment may include:  Wearing a brace  or splint on your wrist or finger.  Taking anti-inflammatory medicine.  Draining fluid from the lump with a needle (aspiration).  Injecting a steroid into the joint.  Surgery to remove the ganglion cyst. HOME CARE INSTRUCTIONS  Do not press on the ganglion cyst, poke it with a needle, or hit it.  Take medicines only as directed by your health care provider.  Wear your brace or splint as directed by your health care provider.  Watch your ganglion cyst for any changes.  Keep all follow-up visits as directed by your health care provider. This is important. SEEK MEDICAL CARE IF:  Your ganglion cyst becomes larger or more painful.  You have increased redness, red streaks, or swelling.  You have pus coming from the lump.  You have weakness or numbness in the affected area.  You have a fever or chills.   This information is not intended to replace advice given to you by your health care provider. Make sure you discuss any questions you have with your health care provider.   Document Released: 10/21/2000 Document Revised: 11/14/2014 Document Reviewed: 04/08/2014 Elsevier Interactive Patient Education 2016 [employer] Inc[employer]   Wrist Pain There are many things that can cause wrist pain. Some common causes include:  An injury to the wrist area, such as a sprain, strain, or fracture.  Overuse of the joint.  A condition that causes increased pressure on a nerve in the wrist (carpal tunnel  syndrome).  Wear and tear of the joints that occurs with aging (osteoarthritis).  A variety of other types of arthritis. Sometimes, the cause of wrist pain is not known. The pain often goes away when you follow your health care provider's instructions for relieving pain at home. If your wrist pain continues, tests may need to be done to diagnose your condition. HOME CARE INSTRUCTIONS Pay attention to any changes in your symptoms. Take these actions to help with your pain:  Rest the wrist area  for at least 48 hours or as told by your health care provider.  If directed, apply ice to the injured area:  Put ice in a plastic bag.  Place a towel between your skin and the bag.  Leave the ice on for 20 minutes, 2-3 times per day.  Keep your arm raised (elevated) above the level of your heart while you are sitting or lying down.  If a splint or elastic bandage has been applied, use it as told by your health care provider.  Remove the splint or bandage only as told by your health care provider.  Loosen the splint or bandage if your fingers become numb or have a tingling feeling, or if they turn cold or blue.  Take over-the-counter and prescription medicines only as told by your health care provider.  Keep all follow-up visits as told by your health care provider. This is important. SEEK MEDICAL CARE IF:  Your pain is not helped by treatment.  Your pain gets worse. SEEK IMMEDIATE MEDICAL CARE IF:  Your fingers become swollen.  Your fingers turn white, very red, or cold and blue.  Your fingers are numb or have a tingling feeling.  You have difficulty moving your fingers.   This information is not intended to replace advice given to you by your health care provider. Make sure you discuss any questions you have with your health care provider.   Document Released: 08/03/2005 Document Revised: 07/15/2015 Document Reviewed: 03/11/2015 Elsevier Interactive Patient Education [employer]

## 2015-12-10 ENCOUNTER — Encounter: Payer: Self-pay | Admitting: Infectious Diseases

## 2015-12-10 ENCOUNTER — Other Ambulatory Visit: Payer: Self-pay | Admitting: Infectious Diseases

## 2015-12-10 ENCOUNTER — Ambulatory Visit (INDEPENDENT_AMBULATORY_CARE_PROVIDER_SITE_OTHER): Payer: Managed Care, Other (non HMO) | Admitting: Infectious Diseases

## 2015-12-10 VITALS — BP 119/73 | HR 89 | Temp 97.9°F | Wt 150.0 lb

## 2015-12-10 DIAGNOSIS — B20 Human immunodeficiency virus [HIV] disease: Secondary | ICD-10-CM

## 2015-12-10 DIAGNOSIS — K611 Rectal abscess: Secondary | ICD-10-CM | POA: Diagnosis not present

## 2015-12-10 LAB — COMPREHENSIVE METABOLIC PANEL
ALBUMIN: 4.5 g/dL (ref 3.6–5.1)
ALT: 14 U/L (ref 9–46)
AST: 20 U/L (ref 10–40)
Alkaline Phosphatase: 79 U/L (ref 40–115)
BILIRUBIN TOTAL: 0.4 mg/dL (ref 0.2–1.2)
BUN: 14 mg/dL (ref 7–25)
CALCIUM: 9.7 mg/dL (ref 8.6–10.3)
CHLORIDE: 105 mmol/L (ref 98–110)
CO2: 30 mmol/L (ref 20–31)
Creat: 0.87 mg/dL (ref 0.60–1.35)
GLUCOSE: 99 mg/dL (ref 65–99)
Potassium: 4.1 mmol/L (ref 3.5–5.3)
Sodium: 140 mmol/L (ref 135–146)
Total Protein: 7.7 g/dL (ref 6.1–8.1)

## 2015-12-10 LAB — LIPID PANEL
Cholesterol: 110 mg/dL — ABNORMAL LOW (ref 125–200)
HDL: 52 mg/dL (ref >=40)
LDL CALC: 34 mg/dL (ref <130)
Total CHOL/HDL Ratio: 2.1 Ratio (ref <=5.0)
Triglycerides: 118 mg/dL (ref <150)
VLDL: 24 mg/dL (ref <30)

## 2015-12-10 LAB — CBC
HCT: 42.7 % (ref 39.0–52.0)
HEMOGLOBIN: 13.6 g/dL (ref 13.0–17.0)
MCH: 26.8 pg (ref 26.0–34.0)
MCHC: 31.9 g/dL (ref 30.0–36.0)
MCV: 84.1 fL (ref 78.0–100.0)
MPV: 9.1 fL (ref 8.6–12.4)
Platelets: 358 10*3/uL (ref 150–400)
RBC: 5.08 MIL/uL (ref 4.22–5.81)
RDW: 13.3 % (ref 11.5–15.5)
WBC: 7.4 10*3/uL (ref 4.0–10.5)

## 2015-12-10 MED ORDER — EMTRICITAB-RILPIVIR-TENOFOV AF 200-25-25 MG PO TABS: 1.0000 | ORAL_TABLET | Freq: Every day | ORAL | Status: DC

## 2015-12-10 NOTE — Progress Notes (Signed)
   Subjective:    Patient ID: Donald Dean, male    DOB: 16-Nov-1980, 35 y.o.   MRN: [id number]  HPI 35 yo M with HIV+ dx 2001 on a routine test. He had a naive genotype 07-2010. He was previously started on ATVr/TRV but was non-adherent. He was changed to [facility] January 2013. Was seen for depression- has been meeting with Adrienne at [facility].  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  No problems with complera.  Taking with food.   HIV 1 RNA QUANT (copies/mL)  Date Value  05/25/2015 <20  09/10/2014 23*  02/24/2014 <20   CD4 T CELL ABS (/uL)  Date Value  05/25/2015 580  09/10/2014 530  02/24/2014 530   Had repeat surgery on his anal abscess 10-15-15. Has f/u appt next week. Has occas pain but mostly resolved.   Review of Systems  Constitutional: Negative for appetite change and unexpected weight change.  Gastrointestinal: Negative for diarrhea and constipation.  Genitourinary: Negative for difficulty urinating and genital sores.       Objective:   Physical Exam  Constitutional: He appears well-developed and well-nourished.  HENT:  Mouth/Throat: No oropharyngeal exudate.  Eyes: EOM are normal. Pupils are equal, round, and reactive to light.  Neck: Neck supple.  Cardiovascular: Normal rate, regular rhythm and normal heart sounds.   Pulmonary/Chest: Effort normal and breath sounds normal.  Abdominal: Soft. Bowel sounds are normal. There is no tenderness. There is no rebound.  Musculoskeletal: He exhibits no edema.  Lymphadenopathy:    He has no cervical adenopathy.      Assessment & Plan:

## 2015-12-10 NOTE — Assessment & Plan Note (Signed)
Has f/u at [facility]. He is being rescheduled.

## 2015-12-10 NOTE — Assessment & Plan Note (Signed)
Will change him to odefsy Given condoms Refuses flu shot Check labs  See him back in 4-5 months

## 2015-12-11 LAB — T-HELPER CELL (CD4) - (RCID CLINIC ONLY)
CD4 T CELL HELPER: 34 % (ref 33–55)
CD4 T Cell Abs: 710 /uL (ref 400–2700)

## 2015-12-11 LAB — FLUORESCENT TREPONEMAL AB(FTA)-IGG-BLD: Fluorescent Treponemal ABS: REACTIVE — AB

## 2015-12-11 LAB — RPR: RPR: REACTIVE — AB

## 2015-12-11 LAB — RPR TITER: RPR Titer: 1:256 {titer} — AB

## 2015-12-14 ENCOUNTER — Ambulatory Visit (INDEPENDENT_AMBULATORY_CARE_PROVIDER_SITE_OTHER): Payer: Managed Care, Other (non HMO) | Admitting: *Deleted

## 2015-12-14 ENCOUNTER — Telehealth: Payer: Self-pay | Admitting: *Deleted

## 2015-12-14 DIAGNOSIS — A539 Syphilis, unspecified: Secondary | ICD-10-CM

## 2015-12-14 LAB — HIV-1 RNA QUANT-NO REFLEX-BLD
HIV 1 RNA QUANT: 80 {copies}/mL — AB (ref <20)
HIV-1 RNA Quant, Log: 1.9 Log copies/mL — ABNORMAL HIGH (ref <1.30)

## 2015-12-14 LAB — URINE CYTOLOGY ANCILLARY ONLY
CHLAMYDIA, DNA PROBE: NEGATIVE
NEISSERIA GONORRHEA: NEGATIVE

## 2015-12-14 MED ORDER — PENICILLIN G BENZATHINE & PROC 900000-300000 UNIT/2ML IM SUSP
1.2000 10*6.[IU] | Freq: Once | INTRAMUSCULAR | Status: AC
  Administered 2015-12-14: 1.2 10*6.[IU] via INTRAMUSCULAR

## 2015-12-14 MED ORDER — PENICILLIN G BENZATHINE 1200000 UNIT/2ML IM SUSP
1.2000 10*6.[IU] | Freq: Once | INTRAMUSCULAR | Status: AC
  Administered 2015-12-14: 1.2 10*6.[IU] via INTRAMUSCULAR

## 2015-12-14 NOTE — Telephone Encounter (Signed)
Patient will come today for treatment. Donald Dean

## 2015-12-14 NOTE — Telephone Encounter (Signed)
-----   Message from Ginnie Smart, MD sent at 12/14/2015  1:09 PM EST ----- Pt needs benzathine PENICILLIN G 2.4 million units IM x 1  ----- Message -----    From: Lab in [facility] Interface    Sent: 12/10/2015   9:30 PM      To: Ginnie Smart, MD

## 2016-06-01 ENCOUNTER — Other Ambulatory Visit: Payer: Self-pay | Admitting: *Deleted

## 2016-06-01 ENCOUNTER — Other Ambulatory Visit: Payer: Managed Care, Other (non HMO)

## 2016-06-01 DIAGNOSIS — Z113 Encounter for screening for infections with a predominantly sexual mode of transmission: Secondary | ICD-10-CM

## 2016-06-01 DIAGNOSIS — B20 Human immunodeficiency virus [HIV] disease: Secondary | ICD-10-CM

## 2016-06-01 LAB — COMPLETE METABOLIC PANEL WITH GFR
ALBUMIN: 4.2 g/dL (ref 3.6–5.1)
ALK PHOS: 57 U/L (ref 40–115)
ALT: 9 U/L (ref 9–46)
AST: 15 U/L (ref 10–40)
BILIRUBIN TOTAL: 0.4 mg/dL (ref 0.2–1.2)
BUN: 15 mg/dL (ref 7–25)
CALCIUM: 9.4 mg/dL (ref 8.6–10.3)
CO2: 27 mmol/L (ref 20–31)
Chloride: 104 mmol/L (ref 98–110)
Creat: 1.08 mg/dL (ref 0.60–1.35)
GFR, EST NON AFRICAN AMERICAN: 89 mL/min (ref >=60)
Glucose, Bld: 115 mg/dL — ABNORMAL HIGH (ref 65–99)
Potassium: 4.4 mmol/L (ref 3.5–5.3)
Sodium: 139 mmol/L (ref 135–146)
TOTAL PROTEIN: 7.3 g/dL (ref 6.1–8.1)

## 2016-06-01 LAB — CBC WITH DIFFERENTIAL/PLATELET
BASOS ABS: 49 {cells}/uL (ref 0–200)
BASOS PCT: 1 %
EOS PCT: 5 %
Eosinophils Absolute: 245 cells/uL (ref 15–500)
HCT: 40.6 % (ref 38.5–50.0)
Hemoglobin: 13.1 g/dL — ABNORMAL LOW (ref 13.2–17.1)
Lymphocytes Relative: 36 %
Lymphs Abs: 1764 cells/uL (ref 850–3900)
MCH: 28.2 pg (ref 27.0–33.0)
MCHC: 32.3 g/dL (ref 32.0–36.0)
MCV: 87.3 fL (ref 80.0–100.0)
MONOS PCT: 5 %
MPV: 9.1 fL (ref 7.5–12.5)
Monocytes Absolute: 245 cells/uL (ref 200–950)
NEUTROS ABS: 2597 {cells}/uL (ref 1500–7800)
Neutrophils Relative %: 53 %
PLATELETS: 304 10*3/uL (ref 140–400)
RBC: 4.65 MIL/uL (ref 4.20–5.80)
RDW: 13.8 % (ref 11.0–15.0)
WBC: 4.9 10*3/uL (ref 3.8–10.8)

## 2016-06-02 LAB — T-HELPER CELL (CD4) - (RCID CLINIC ONLY)
CD4 % Helper T Cell: 29 % — ABNORMAL LOW (ref 33–55)
CD4 T Cell Abs: 520 /uL (ref 400–2700)

## 2016-06-02 LAB — FLUORESCENT TREPONEMAL AB(FTA)-IGG-BLD: Fluorescent Treponemal ABS: REACTIVE — AB

## 2016-06-02 LAB — RPR TITER: RPR Titer: 1:2 {titer}

## 2016-06-02 LAB — RPR: RPR Ser Ql: REACTIVE — AB

## 2016-06-06 LAB — HIV-1 RNA QUANT-NO REFLEX-BLD

## 2016-06-15 ENCOUNTER — Ambulatory Visit (INDEPENDENT_AMBULATORY_CARE_PROVIDER_SITE_OTHER): Payer: Managed Care, Other (non HMO) | Admitting: Infectious Diseases

## 2016-06-15 ENCOUNTER — Encounter: Payer: Self-pay | Admitting: Infectious Diseases

## 2016-06-15 VITALS — BP 105/67 | HR 66 | Temp 98.2°F | Ht 69.0 in | Wt 149.0 lb

## 2016-06-15 DIAGNOSIS — B20 Human immunodeficiency virus [HIV] disease: Secondary | ICD-10-CM | POA: Diagnosis not present

## 2016-06-15 DIAGNOSIS — A539 Syphilis, unspecified: Secondary | ICD-10-CM

## 2016-06-15 DIAGNOSIS — Z79899 Other long term (current) drug therapy: Secondary | ICD-10-CM

## 2016-06-15 DIAGNOSIS — Z7251 High risk heterosexual behavior: Secondary | ICD-10-CM

## 2016-06-15 NOTE — Progress Notes (Signed)
   Subjective:    Patient ID: Donald Dean, male    DOB: 04/20/1981, 35 y.o.   MRN: [id number]  HPI 35 yo M diagnosed with HIV in 2001.  He was last seen 12/10/15.  He has been on ATVr/TRV then switched to Complera.  He is currently on Odefsey.   He reports taking his medication everyday with food.  Denies use of OTC acid blockers, such as PPIs.  Patient does report being off meds x 1 month (June) related to ADAP and insurance issues.  However, he restarted medications in July and labs drawn after being on meds 3-4 weeks.  Patient reports 2 partners in the past 6 months and use of condoms always.  Patient seen in clinic for routine follow-up without chief complaints.  Lab Results  Component Value Date   CD4TABS 520 06/01/2016   CD4TABS 710 12/10/2015   CD4TABS 580 05/25/2015   Lab Results  Component Value Date   HIV1RNAQUANT <20 06/01/2016   Review of Systems  Constitutional: Negative for chills and fever.  HENT: Negative for mouth sores, sore throat and trouble swallowing.   Respiratory: Negative for cough and shortness of breath.   Cardiovascular: Negative for chest pain.  Gastrointestinal: Negative for abdominal pain, constipation, diarrhea and vomiting.  Genitourinary: Negative for discharge and dysuria.  Neurological: Negative for light-headedness and headaches.      Objective:   Physical Exam  Constitutional: He is oriented to person, place, and time. He appears well-developed and well-nourished.  HENT:  Mouth/Throat: No oropharyngeal exudate.  Eyes: Pupils are equal, round, and reactive to light.  Cardiovascular: Normal rate, regular rhythm and normal heart sounds.   No murmur heard. Pulmonary/Chest: Effort normal and breath sounds normal. No respiratory distress. He has no wheezes.  Abdominal: Soft. Bowel sounds are normal. He exhibits no distension. There is no tenderness.  Lymphadenopathy:    He has no cervical adenopathy.  Neurological: He is alert and oriented to  person, place, and time.  Psychiatric: He has a normal mood and affect. His behavior is normal. Judgment and thought content normal.      Assessment & Plan:  Human immunodeficiency virus (HIV) disease Patient reports feeling well today. Denies any side effects from ART. HIV under excellent control. Continue regimen without changes. Condoms offered/accepted. RTC in 6 months with labs prior.  SYPHILIS RPR titer 1:2 as of 06/01/16, which shows improvement after treatment (1:256 on 12/14/15). Discussed safe sex practices. Condoms offered/accepted.

## 2016-06-15 NOTE — Assessment & Plan Note (Addendum)
RPR titer 1:2 as of 06/01/16, which shows improvement after treatment (1:256 on 12/14/15). Discussed safe sex practices. Condoms offered/accepted.

## 2016-06-15 NOTE — Assessment & Plan Note (Signed)
Patient reports feeling well today. Denies any side effects from ART. HIV under excellent control. Continue regimen without changes. Condoms offered/accepted. RTC in 6 months with labs prior.

## 2016-10-11 ENCOUNTER — Encounter (HOSPITAL_COMMUNITY): Payer: Self-pay | Admitting: Emergency Medicine

## 2016-10-11 ENCOUNTER — Emergency Department (HOSPITAL_COMMUNITY)
Admission: EM | Admit: 2016-10-11 | Discharge: 2016-10-11 | Disposition: A | Discharge status: Home / Self Care | Payer: Managed Care, Other (non HMO) | Attending: Emergency Medicine | Admitting: Emergency Medicine

## 2016-10-11 DIAGNOSIS — M545 Low back pain, unspecified: Secondary | ICD-10-CM

## 2016-10-11 DIAGNOSIS — F17200 Nicotine dependence, unspecified, uncomplicated: Secondary | ICD-10-CM | POA: Diagnosis not present

## 2016-10-11 MED ORDER — METHOCARBAMOL 500 MG PO TABS: 500.0000 mg | ORAL_TABLET | Freq: Two times a day (BID) | ORAL | 0 refills | Status: AC

## 2016-10-11 MED ORDER — NAPROXEN 500 MG PO TABS: 500.0000 mg | ORAL_TABLET | Freq: Two times a day (BID) | ORAL | 0 refills | Status: AC
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
## 2016-10-11 NOTE — Discharge Instructions (Signed)
Take the prescribed medication as directed.  Wear your back brace when working for support. Follow-up with your primary care doctor. Return to the ED for new or worsening symptoms.

## 2016-10-11 NOTE — ED Triage Notes (Signed)
Pt states his lower back has been hurting and pain goes into bilateral knees. No injury. Denies urinary symptoms.

## 2016-11-22 ENCOUNTER — Other Ambulatory Visit (HOSPITAL_COMMUNITY)
Admission: RE | Admit: 2016-11-22 | Discharge: 2016-11-22 | Disposition: A | Discharge status: Home / Self Care | Payer: Managed Care, Other (non HMO) | Source: Ambulatory Visit | Attending: Infectious Diseases | Admitting: Infectious Diseases

## 2016-11-22 ENCOUNTER — Other Ambulatory Visit: Payer: Managed Care, Other (non HMO)

## 2016-11-22 DIAGNOSIS — B20 Human immunodeficiency virus [HIV] disease: Secondary | ICD-10-CM

## 2016-11-22 DIAGNOSIS — Z113 Encounter for screening for infections with a predominantly sexual mode of transmission: Secondary | ICD-10-CM | POA: Insufficient documentation

## 2016-11-22 DIAGNOSIS — Z79899 Other long term (current) drug therapy: Secondary | ICD-10-CM

## 2016-11-22 DIAGNOSIS — Z7251 High risk heterosexual behavior: Secondary | ICD-10-CM

## 2016-11-22 DIAGNOSIS — A539 Syphilis, unspecified: Secondary | ICD-10-CM

## 2016-11-22 LAB — COMPREHENSIVE METABOLIC PANEL
ALBUMIN: 4.1 g/dL (ref 3.6–5.1)
ALK PHOS: 58 U/L (ref 40–115)
ALT: 11 U/L (ref 9–46)
AST: 16 U/L (ref 10–40)
BILIRUBIN TOTAL: 0.3 mg/dL (ref 0.2–1.2)
BUN: 14 mg/dL (ref 7–25)
CHLORIDE: 104 mmol/L (ref 98–110)
CO2: 32 mmol/L — ABNORMAL HIGH (ref 20–31)
CREATININE: 1.02 mg/dL (ref 0.60–1.35)
Calcium: 9.7 mg/dL (ref 8.6–10.3)
Glucose, Bld: 85 mg/dL (ref 65–99)
Potassium: 4.2 mmol/L (ref 3.5–5.3)
SODIUM: 140 mmol/L (ref 135–146)
TOTAL PROTEIN: 7.3 g/dL (ref 6.1–8.1)

## 2016-11-22 LAB — LIPID PANEL
CHOLESTEROL: 106 mg/dL (ref <200)
HDL: 49 mg/dL (ref >40)
LDL Cholesterol: 27 mg/dL (ref <100)
TRIGLYCERIDES: 152 mg/dL — AB (ref <150)
Total CHOL/HDL Ratio: 2.2 Ratio (ref <5.0)
VLDL: 30 mg/dL (ref <30)

## 2016-11-22 LAB — CBC
HCT: 40.5 % (ref 38.5–50.0)
HEMOGLOBIN: 13 g/dL — AB (ref 13.2–17.1)
MCH: 27.4 pg (ref 27.0–33.0)
MCHC: 32.1 g/dL (ref 32.0–36.0)
MCV: 85.4 fL (ref 80.0–100.0)
MPV: 9.1 fL (ref 7.5–12.5)
PLATELETS: 392 10*3/uL (ref 140–400)
RBC: 4.74 MIL/uL (ref 4.20–5.80)
RDW: 13 % (ref 11.0–15.0)
WBC: 6.6 10*3/uL (ref 3.8–10.8)

## 2016-11-23 LAB — RPR TITER: RPR Titer: 1:4 {titer}

## 2016-11-23 LAB — FLUORESCENT TREPONEMAL AB(FTA)-IGG-BLD: Fluorescent Treponemal ABS: REACTIVE — AB

## 2016-11-23 LAB — T-HELPER CELL (CD4) - (RCID CLINIC ONLY)
CD4 % Helper T Cell: 32 % — ABNORMAL LOW (ref 33–55)
CD4 T CELL ABS: 740 /uL (ref 400–2700)

## 2016-11-23 LAB — RPR: RPR Ser Ql: REACTIVE — AB

## 2016-11-24 LAB — URINE CYTOLOGY ANCILLARY ONLY
Chlamydia: NEGATIVE
Neisseria Gonorrhea: NEGATIVE

## 2016-11-25 LAB — HIV-1 RNA QUANT-NO REFLEX-BLD
HIV 1 RNA QUANT: DETECTED {copies}/mL — AB (ref <20)
HIV-1 RNA QUANT, LOG: DETECTED {Log_copies}/mL — AB (ref <1.30)

## 2016-12-06 ENCOUNTER — Ambulatory Visit (INDEPENDENT_AMBULATORY_CARE_PROVIDER_SITE_OTHER): Payer: Managed Care, Other (non HMO) | Admitting: Infectious Diseases

## 2016-12-06 ENCOUNTER — Encounter: Payer: Self-pay | Admitting: Infectious Diseases

## 2016-12-06 VITALS — BP 128/74 | HR 73 | Temp 98.1°F | Wt 154.0 lb

## 2016-12-06 DIAGNOSIS — Z23 Encounter for immunization: Secondary | ICD-10-CM

## 2016-12-06 DIAGNOSIS — Z789 Other specified health status: Secondary | ICD-10-CM | POA: Insufficient documentation

## 2016-12-06 DIAGNOSIS — A539 Syphilis, unspecified: Secondary | ICD-10-CM | POA: Diagnosis not present

## 2016-12-06 DIAGNOSIS — Z113 Encounter for screening for infections with a predominantly sexual mode of transmission: Secondary | ICD-10-CM

## 2016-12-06 DIAGNOSIS — Z79899 Other long term (current) drug therapy: Secondary | ICD-10-CM

## 2016-12-06 DIAGNOSIS — B20 Human immunodeficiency virus [HIV] disease: Secondary | ICD-10-CM | POA: Diagnosis not present

## 2016-12-06 NOTE — Assessment & Plan Note (Signed)
Discussed his most recent RPR. He did not get reinfected.  Will continue to monitor.

## 2016-12-06 NOTE — Progress Notes (Signed)
   Subjective:    Patient ID: Donald Guessonald A Dean, male    DOB: 03/18/1981, 36 y.o.   MRN: [id number]  HPI 36 yo M diagnosed with HIV+ 2001. He has been on ATVr/TRV ---> Complera -->Odefsey.    HIV 1 RNA Quant (copies/mL)  Date Value  11/22/2016 <20 DETECTED (A)  06/01/2016 <20  12/10/2015 80 (H)   CD4 T Cell Abs (/uL)  Date Value  11/22/2016 740  06/01/2016 520  12/10/2015 710    Has been doing well with his ART. No missed. Needs med refills.  Has been feeling well. Thought he had the flu earlier this month.  Nothing new in love life, "don't have one".   Review of Systems  Constitutional: Negative for appetite change and unexpected weight change.  Gastrointestinal: Negative for constipation and diarrhea.  Genitourinary: Negative for difficulty urinating.  Psychiatric/Behavioral: Negative for dysphoric mood.  thinks he may have been exposed to syphilis due to his RPR increase.      Objective:   Physical Exam  Constitutional: He appears well-developed and well-nourished.  HENT:  Mouth/Throat: No oropharyngeal exudate.  Eyes: EOM are normal. Pupils are equal, round, and reactive to light.  Neck: Neck supple.  Cardiovascular: Normal rate, regular rhythm and normal heart sounds.   Pulmonary/Chest: Effort normal and breath sounds normal.  Abdominal: Soft. Bowel sounds are normal. There is no tenderness. There is no rebound.  Musculoskeletal: He exhibits no edema.  Lymphadenopathy:    He has no cervical adenopathy.      Assessment & Plan:

## 2016-12-06 NOTE — Assessment & Plan Note (Signed)
He is doing well Offered/refused condoms.  Try to get him co-pay card.  Flu shot today Start mening series rtc in 6 months

## 2016-12-23 ENCOUNTER — Other Ambulatory Visit: Payer: Self-pay | Admitting: Infectious Diseases

## 2016-12-23 DIAGNOSIS — B20 Human immunodeficiency virus [HIV] disease: Secondary | ICD-10-CM

## 2017-06-12 ENCOUNTER — Other Ambulatory Visit: Payer: Managed Care, Other (non HMO)

## 2017-06-26 ENCOUNTER — Ambulatory Visit: Payer: Managed Care, Other (non HMO) | Admitting: Infectious Diseases

## 2017-07-22 IMAGING — CR DG WRIST COMPLETE 3+V*L*
4 series · 4 of 4 positions shown · non-contrast
Comparison: None.

CLINICAL DATA: Radial side left wrist pain and swelling for 3 days.
No known injury. Initial encounter.

EXAM:
LEFT WRIST - COMPLETE 3+ VIEW

[wrist pa]
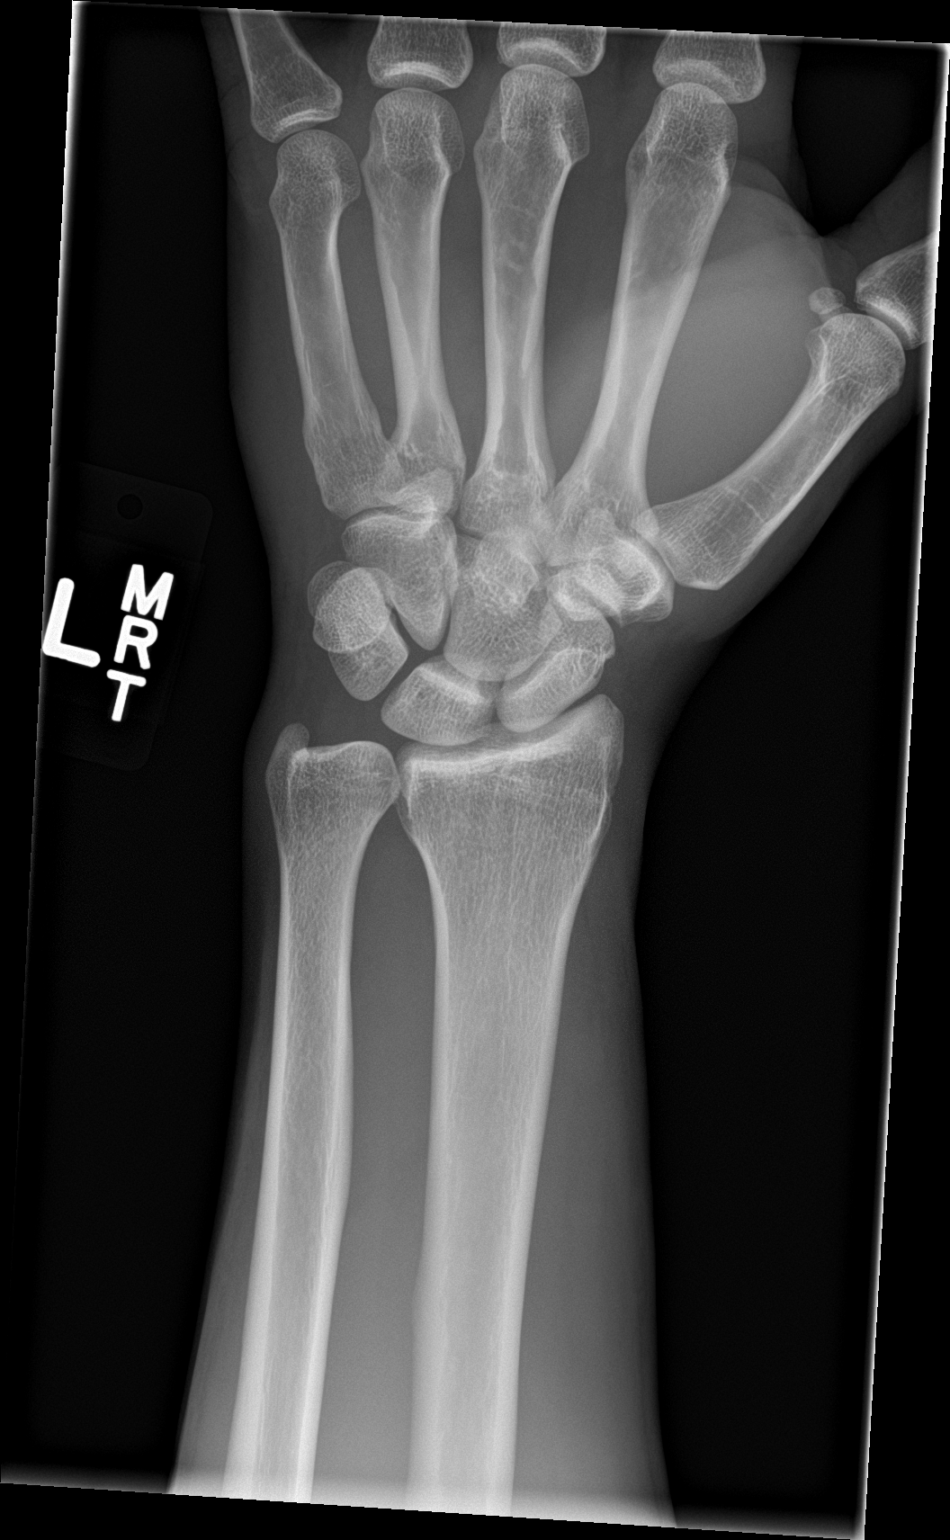

[wrist obl]
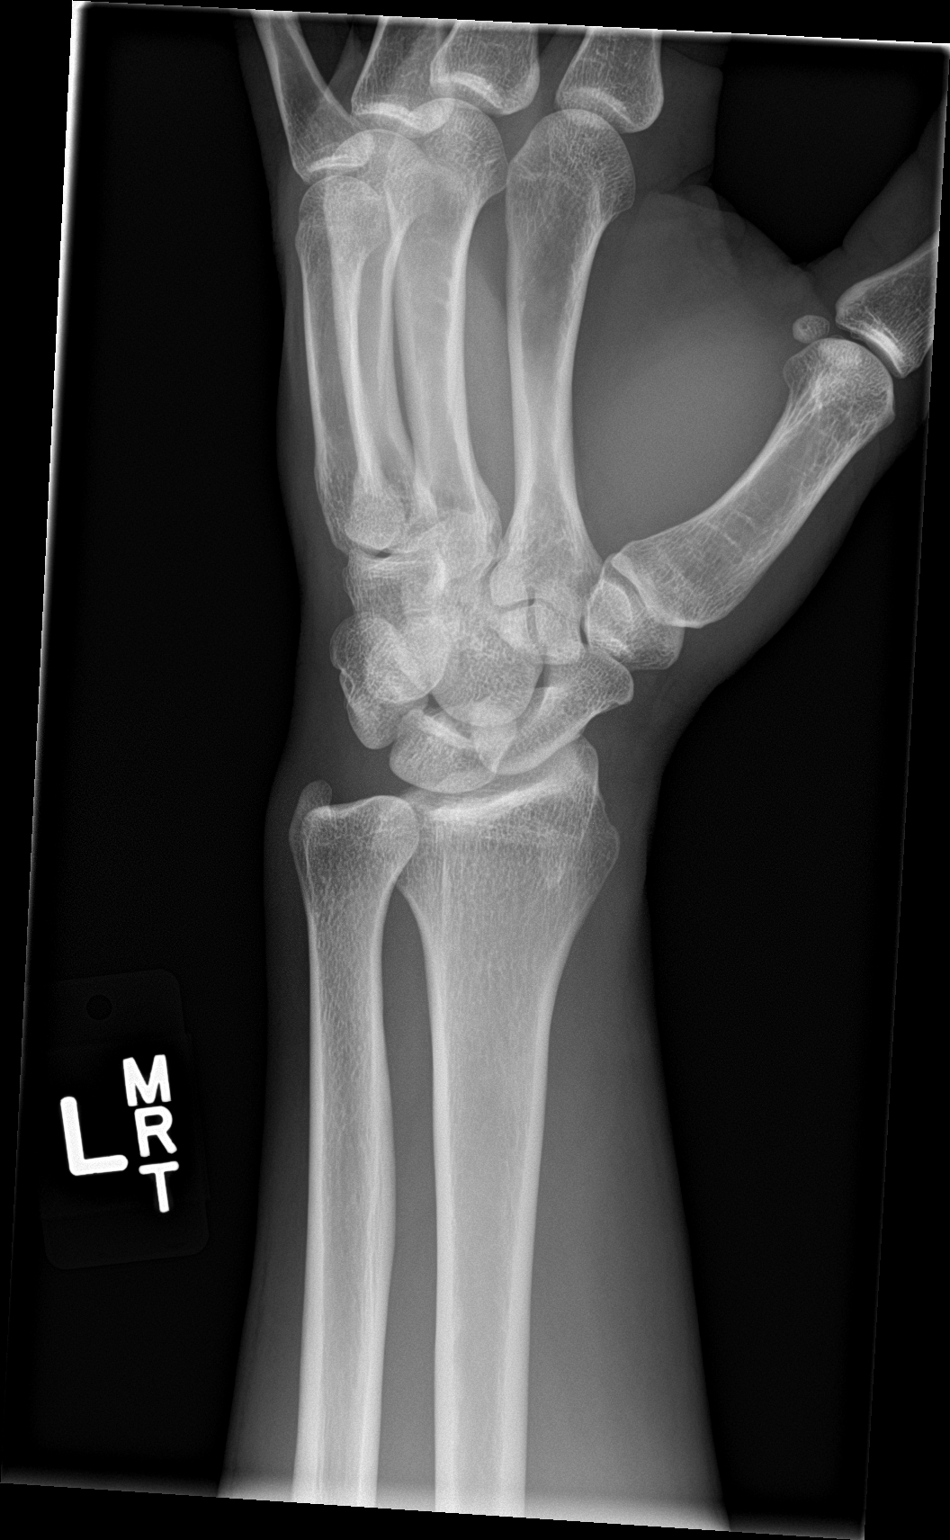

[wrist lat]
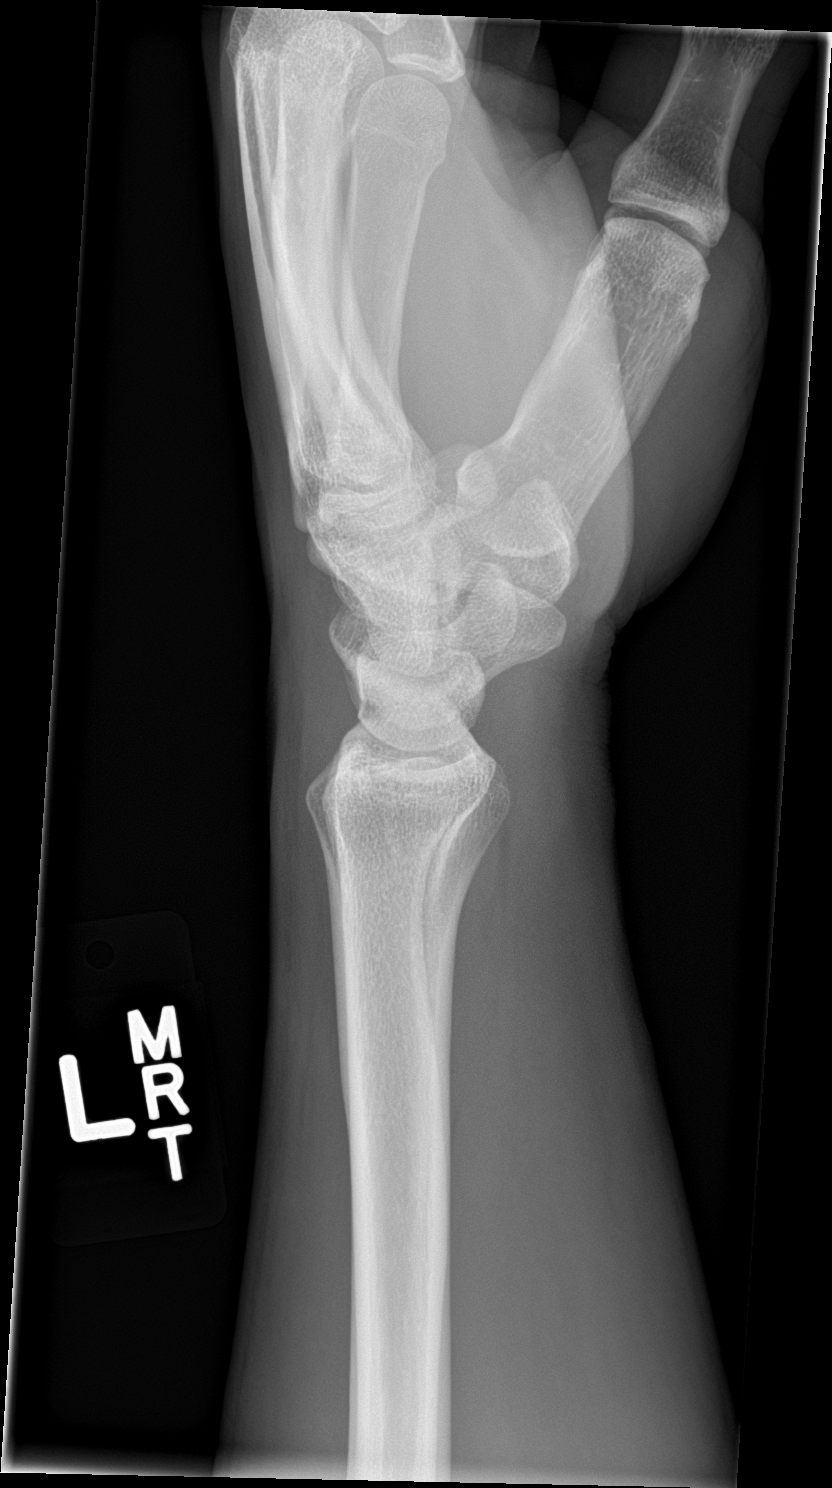

[wrist navicular]
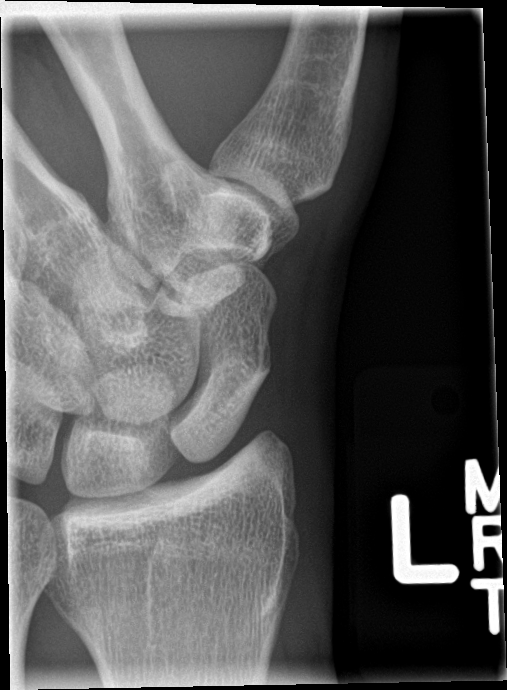

[4 of 4 positions shown; findings below may reference images not displayed]

FINDINGS: There is no evidence of fracture or dislocation. There is no
evidence of arthropathy or other focal bone abnormality. Soft
tissues are unremarkable.
IMPRESSION: Normal exam.

## 2017-08-16 ENCOUNTER — Encounter: Payer: Self-pay | Admitting: Infectious Diseases

## 2017-08-16 ENCOUNTER — Ambulatory Visit (INDEPENDENT_AMBULATORY_CARE_PROVIDER_SITE_OTHER): Payer: Managed Care, Other (non HMO) | Admitting: Infectious Diseases

## 2017-08-16 VITALS — BP 124/72 | HR 56 | Temp 98.4°F | Wt 153.0 lb

## 2017-08-16 DIAGNOSIS — F32A Depression, unspecified: Secondary | ICD-10-CM

## 2017-08-16 DIAGNOSIS — Z113 Encounter for screening for infections with a predominantly sexual mode of transmission: Secondary | ICD-10-CM

## 2017-08-16 DIAGNOSIS — B20 Human immunodeficiency virus [HIV] disease: Secondary | ICD-10-CM | POA: Diagnosis not present

## 2017-08-16 DIAGNOSIS — L0292 Furuncle, unspecified: Secondary | ICD-10-CM | POA: Diagnosis not present

## 2017-08-16 DIAGNOSIS — F329 Major depressive disorder, single episode, unspecified: Secondary | ICD-10-CM | POA: Diagnosis not present

## 2017-08-16 DIAGNOSIS — Z79899 Other long term (current) drug therapy: Secondary | ICD-10-CM

## 2017-08-16 DIAGNOSIS — L0293 Carbuncle, unspecified: Secondary | ICD-10-CM | POA: Diagnosis not present

## 2017-08-16 DIAGNOSIS — Z23 Encounter for immunization: Secondary | ICD-10-CM | POA: Diagnosis not present

## 2017-08-16 NOTE — Assessment & Plan Note (Signed)
Denies currently.  Will monitor prn.

## 2017-08-16 NOTE — Assessment & Plan Note (Signed)
Resolved per pt.

## 2017-08-16 NOTE — Addendum Note (Signed)
Addended by: Lorenso Courier on: 08/16/2017 12:45 PM   Modules accepted: Orders

## 2017-08-16 NOTE — Progress Notes (Signed)
   Subjective:    Patient ID: Donald Dean, male    DOB: 17-Jan-1981, 35 y.o.   MRN: [id number]  HPI 36 yo M with HIV+ since 2001.  Currently ART is odefsy (prev reyetaz-norvir-truvada).   Has been taking ART with food. Has been off ART for "a couple of weeks". Was unable to get refill.   HIV 1 RNA Quant (copies/mL)  Date Value  11/22/2016 <20 DETECTED (A)  06/01/2016 <20  12/10/2015 80 (H)   CD4 T Cell Abs (/uL)  Date Value  11/22/2016 740  06/01/2016 520  12/10/2015 710    Review of Systems  Constitutional: Negative for appetite change, chills, fever and unexpected weight change.  HENT: Negative for mouth sores.   Eyes: Negative for visual disturbance.  Respiratory: Negative for cough and shortness of breath.   Gastrointestinal: Negative for constipation and diarrhea.  Genitourinary: Negative for difficulty urinating and genital sores.  Neurological: Negative for headaches.  Psychiatric/Behavioral: Negative for dysphoric mood and sleep disturbance.  Please see HPI. 12 point ROS o/w (-)      Objective:   Physical Exam  Constitutional: He appears well-developed and well-nourished.  HENT:  Mouth/Throat: No oropharyngeal exudate.  Eyes: Pupils are equal, round, and reactive to light. EOM are normal.  Neck: Neck supple.  Cardiovascular: Normal rate, regular rhythm and normal heart sounds.   Pulmonary/Chest: Effort normal and breath sounds normal.  Abdominal: Soft. Bowel sounds are normal. There is no tenderness. There is no rebound.  Musculoskeletal: He exhibits no edema.  Lymphadenopathy:    He has no cervical adenopathy.  Psychiatric: He has a normal mood and affect.      Assessment & Plan:

## 2017-08-16 NOTE — Assessment & Plan Note (Signed)
Will get him in with pharm to understand his med-disconnect Flu and mening vax today Offered/refused condoms. rtc in 3-4 months and will check his labs then He may be moving to [place] with work relocation. Offered to assist him with this.

## 2017-11-20 ENCOUNTER — Ambulatory Visit: Payer: Managed Care, Other (non HMO) | Admitting: Infectious Diseases
